# Patient Record
Sex: Male | Born: 1990 | Race: White | Hispanic: No | Marital: Single | Smoking: Former smoker
Health system: Southern US, Community
[De-identification: ages and names within clinical notes are randomized; demographics above are authoritative.]

## PROBLEM LIST (undated history)

## (undated) DIAGNOSIS — T7840XA Allergy, unspecified, initial encounter: Secondary | ICD-10-CM

## (undated) DIAGNOSIS — J329 Chronic sinusitis, unspecified: Secondary | ICD-10-CM

## (undated) DIAGNOSIS — K219 Gastro-esophageal reflux disease without esophagitis: Secondary | ICD-10-CM

## (undated) DIAGNOSIS — J302 Other seasonal allergic rhinitis: Secondary | ICD-10-CM

## (undated) DIAGNOSIS — R569 Unspecified convulsions: Secondary | ICD-10-CM

## (undated) DIAGNOSIS — J45909 Unspecified asthma, uncomplicated: Secondary | ICD-10-CM

## (undated) HISTORY — DX: Allergy, unspecified, initial encounter: T78.40XA

## (undated) HISTORY — DX: Chronic sinusitis, unspecified: J32.9

## (undated) HISTORY — DX: Unspecified convulsions: R56.9

## (undated) HISTORY — DX: Unspecified asthma, uncomplicated: J45.909

---
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
## 2014-03-24 ENCOUNTER — Inpatient Hospital Stay: Payer: Self-pay | Admitting: Internal Medicine

## 2014-03-24 LAB — DRUG SCREEN, URINE

## 2014-03-24 LAB — CBC
HCT: 46 % (ref 40.0–52.0)
HGB: 15.5 g/dL (ref 13.0–18.0)
MCH: 27.3 pg (ref 26.0–34.0)
MCHC: 33.6 g/dL (ref 32.0–36.0)
MCV: 81 fL (ref 80–100)
Platelet: 236 10*3/uL (ref 150–440)
RBC: 5.67 10*6/uL (ref 4.40–5.90)
RDW: 13.8 % (ref 11.5–14.5)
WBC: 12.7 10*3/uL — AB (ref 3.8–10.6)

## 2014-03-24 LAB — BASIC METABOLIC PANEL
ANION GAP: 3 — AB (ref 7–16)
BUN: 11 mg/dL (ref 7–18)
CALCIUM: 8.4 mg/dL — AB (ref 8.5–10.1)
CREATININE: 0.92 mg/dL (ref 0.60–1.30)
Chloride: 107 mmol/L (ref 98–107)
Co2: 28 mmol/L (ref 21–32)
EGFR (African American): >60
EGFR (Non-African Amer.): >60
Glucose: 121 mg/dL — ABNORMAL HIGH (ref 65–99)
Osmolality: 276 (ref 275–301)
Potassium: 3.7 mmol/L (ref 3.5–5.1)
Sodium: 138 mmol/L (ref 136–145)

## 2014-03-24 LAB — TROPONIN I: Troponin-I: <0.02 ng/mL

## 2014-03-24 LAB — D-DIMER(ARMC): D-Dimer: 345 ng/ml

## 2014-03-24 LAB — HEMOGLOBIN A1C: HEMOGLOBIN A1C: 5.5 % (ref 4.2–6.3)

## 2014-03-25 LAB — COMPREHENSIVE METABOLIC PANEL
ALBUMIN: 3.6 g/dL (ref 3.4–5.0)
Alkaline Phosphatase: 51 U/L
Anion Gap: 5 — ABNORMAL LOW (ref 7–16)
BILIRUBIN TOTAL: 0.4 mg/dL (ref 0.2–1.0)
BUN: 14 mg/dL (ref 7–18)
Calcium, Total: 9.1 mg/dL (ref 8.5–10.1)
Chloride: 106 mmol/L (ref 98–107)
Co2: 25 mmol/L (ref 21–32)
Creatinine: 0.9 mg/dL (ref 0.60–1.30)
EGFR (African American): >60
Glucose: 135 mg/dL — ABNORMAL HIGH (ref 65–99)
OSMOLALITY: 274 (ref 275–301)
Potassium: 4.6 mmol/L (ref 3.5–5.1)
SGOT(AST): 23 U/L (ref 15–37)
SGPT (ALT): 49 U/L (ref 12–78)
Sodium: 136 mmol/L (ref 136–145)
Total Protein: 7.4 g/dL (ref 6.4–8.2)

## 2014-03-25 LAB — CBC WITH DIFFERENTIAL/PLATELET
Basophil #: 0 10*3/uL (ref 0.0–0.1)
Basophil %: 0.2 %
EOS PCT: 0.1 %
Eosinophil #: 0 10*3/uL (ref 0.0–0.7)
HCT: 41.5 % (ref 40.0–52.0)
HGB: 14.1 g/dL (ref 13.0–18.0)
LYMPHS ABS: 1.1 10*3/uL (ref 1.0–3.6)
Lymphocyte %: 8.8 %
MCH: 27.4 pg (ref 26.0–34.0)
MCHC: 34 g/dL (ref 32.0–36.0)
MCV: 81 fL (ref 80–100)
MONOS PCT: 3.8 %
Monocyte #: 0.5 x10 3/mm (ref 0.2–1.0)
NEUTROS ABS: 11.3 10*3/uL — AB (ref 1.4–6.5)
NEUTROS PCT: 87.1 %
Platelet: 204 10*3/uL (ref 150–440)
RBC: 5.16 10*6/uL (ref 4.40–5.90)
RDW: 13.6 % (ref 11.5–14.5)
WBC: 12.9 10*3/uL — ABNORMAL HIGH (ref 3.8–10.6)

## 2014-04-29 ENCOUNTER — Emergency Department: Payer: Self-pay | Admitting: Internal Medicine

## 2014-04-29 LAB — BASIC METABOLIC PANEL
Anion Gap: 7 (ref 7–16)
BUN: 8 mg/dL (ref 7–18)
CALCIUM: 9.4 mg/dL (ref 8.5–10.1)
CHLORIDE: 103 mmol/L (ref 98–107)
CREATININE: 0.82 mg/dL (ref 0.60–1.30)
Co2: 29 mmol/L (ref 21–32)
EGFR (African American): >60
EGFR (Non-African Amer.): >60
GLUCOSE: 99 mg/dL (ref 65–99)
OSMOLALITY: 276 (ref 275–301)
Potassium: 3.9 mmol/L (ref 3.5–5.1)
Sodium: 139 mmol/L (ref 136–145)

## 2014-04-29 LAB — CBC
HCT: 49.4 % (ref 40.0–52.0)
HGB: 16.2 g/dL (ref 13.0–18.0)
MCH: 26.9 pg (ref 26.0–34.0)
MCHC: 32.8 g/dL (ref 32.0–36.0)
MCV: 82 fL (ref 80–100)
Platelet: 232 10*3/uL (ref 150–440)
RBC: 6.02 10*6/uL — ABNORMAL HIGH (ref 4.40–5.90)
RDW: 13.8 % (ref 11.5–14.5)
WBC: 8.3 10*3/uL (ref 3.8–10.6)

## 2014-04-29 LAB — TROPONIN I

## 2014-09-11 ENCOUNTER — Emergency Department: Payer: Self-pay | Admitting: Emergency Medicine

## 2014-09-11 LAB — CBC WITH DIFFERENTIAL/PLATELET
BASOS ABS: 0.1 10*3/uL (ref 0.0–0.1)
Basophil %: 0.5 %
EOS PCT: 1.1 %
Eosinophil #: 0.2 10*3/uL (ref 0.0–0.7)
HCT: 45.6 % (ref 40.0–52.0)
HGB: 14.8 g/dL (ref 13.0–18.0)
LYMPHS ABS: 0.8 10*3/uL — AB (ref 1.0–3.6)
LYMPHS PCT: 5.8 %
MCH: 26.6 pg (ref 26.0–34.0)
MCHC: 32.4 g/dL (ref 32.0–36.0)
MCV: 82 fL (ref 80–100)
MONOS PCT: 5.8 %
Monocyte #: 0.8 x10 3/mm (ref 0.2–1.0)
NEUTROS ABS: 11.9 10*3/uL — AB (ref 1.4–6.5)
Neutrophil %: 86.8 %
PLATELETS: 174 10*3/uL (ref 150–440)
RBC: 5.54 10*6/uL (ref 4.40–5.90)
RDW: 13.2 % (ref 11.5–14.5)
WBC: 13.7 10*3/uL — ABNORMAL HIGH (ref 3.8–10.6)

## 2014-09-11 LAB — COMPREHENSIVE METABOLIC PANEL
ALK PHOS: 53 U/L
ANION GAP: 9 (ref 7–16)
Albumin: 3.9 g/dL (ref 3.4–5.0)
BUN: 14 mg/dL (ref 7–18)
Bilirubin,Total: 0.4 mg/dL (ref 0.2–1.0)
CHLORIDE: 110 mmol/L — AB (ref 98–107)
CO2: 21 mmol/L (ref 21–32)
CREATININE: 1.04 mg/dL (ref 0.60–1.30)
Calcium, Total: 8.7 mg/dL (ref 8.5–10.1)
EGFR (Non-African Amer.): >60
GLUCOSE: 124 mg/dL — AB (ref 65–99)
Osmolality: 281 (ref 275–301)
Potassium: 3.7 mmol/L (ref 3.5–5.1)
SGOT(AST): 39 U/L — ABNORMAL HIGH (ref 15–37)
SGPT (ALT): 42 U/L
Sodium: 140 mmol/L (ref 136–145)
TOTAL PROTEIN: 7.1 g/dL (ref 6.4–8.2)

## 2014-09-11 LAB — MAGNESIUM: MAGNESIUM: 1.6 mg/dL — AB

## 2014-09-14 ENCOUNTER — Inpatient Hospital Stay: Payer: Self-pay | Admitting: Internal Medicine

## 2014-09-14 ENCOUNTER — Ambulatory Visit: Payer: Self-pay | Admitting: Neurology

## 2014-09-14 LAB — DRUG SCREEN, URINE
Amphetamines, Ur Screen: NEGATIVE (ref ?–1000)
BENZODIAZEPINE, UR SCRN: POSITIVE (ref ?–200)
Barbiturates, Ur Screen: NEGATIVE (ref ?–200)
Cannabinoid 50 Ng, Ur ~~LOC~~: NEGATIVE (ref ?–50)
Cocaine Metabolite,Ur ~~LOC~~: NEGATIVE (ref ?–300)
MDMA (ECSTASY) UR SCREEN: NEGATIVE (ref ?–500)
METHADONE, UR SCREEN: NEGATIVE (ref ?–300)
Opiate, Ur Screen: NEGATIVE (ref ?–300)
PHENCYCLIDINE (PCP) UR S: NEGATIVE (ref ?–25)
Tricyclic, Ur Screen: NEGATIVE (ref ?–1000)

## 2014-09-14 LAB — COMPREHENSIVE METABOLIC PANEL
ALBUMIN: 4.4 g/dL (ref 3.4–5.0)
ANION GAP: 6 — AB (ref 7–16)
AST: 46 U/L — AB (ref 15–37)
Alkaline Phosphatase: 59 U/L
BUN: 19 mg/dL — ABNORMAL HIGH (ref 7–18)
Bilirubin,Total: 0.3 mg/dL (ref 0.2–1.0)
CHLORIDE: 104 mmol/L (ref 98–107)
Calcium, Total: 9 mg/dL (ref 8.5–10.1)
Co2: 29 mmol/L (ref 21–32)
Creatinine: 1.27 mg/dL (ref 0.60–1.30)
Glucose: 158 mg/dL — ABNORMAL HIGH (ref 65–99)
Osmolality: 283 (ref 275–301)
Potassium: 4.5 mmol/L (ref 3.5–5.1)
SGPT (ALT): 51 U/L
SODIUM: 139 mmol/L (ref 136–145)
Total Protein: 7.8 g/dL (ref 6.4–8.2)

## 2014-09-14 LAB — CBC WITH DIFFERENTIAL/PLATELET
Basophil #: 0.2 10*3/uL — ABNORMAL HIGH (ref 0.0–0.1)
Basophil %: 0.7 %
EOS PCT: 2.7 %
Eosinophil #: 0.6 10*3/uL (ref 0.0–0.7)
HCT: 47.8 % (ref 40.0–52.0)
HGB: 15.1 g/dL (ref 13.0–18.0)
LYMPHS ABS: 10.5 10*3/uL — AB (ref 1.0–3.6)
Lymphocyte %: 46.1 %
MCH: 26.8 pg (ref 26.0–34.0)
MCHC: 31.7 g/dL — ABNORMAL LOW (ref 32.0–36.0)
MCV: 85 fL (ref 80–100)
MONO ABS: 2.2 x10 3/mm — AB (ref 0.2–1.0)
Monocyte %: 9.9 %
NEUTROS PCT: 40.6 %
Neutrophil #: 9.2 10*3/uL — ABNORMAL HIGH (ref 1.4–6.5)
PLATELETS: 310 10*3/uL (ref 150–440)
RBC: 5.65 10*6/uL (ref 4.40–5.90)
RDW: 13.6 % (ref 11.5–14.5)
WBC: 22.8 10*3/uL — ABNORMAL HIGH (ref 3.8–10.6)

## 2014-09-14 LAB — MAGNESIUM: MAGNESIUM: 2 mg/dL

## 2014-09-14 LAB — PHENYTOIN LEVEL, TOTAL: Dilantin: <0.4 ug/mL — ABNORMAL LOW (ref 10.0–20.0)

## 2014-09-15 LAB — CBC WITH DIFFERENTIAL/PLATELET
BASOS ABS: 0 10*3/uL (ref 0.0–0.1)
Basophil %: 0.3 %
EOS PCT: 1.2 %
Eosinophil #: 0.1 10*3/uL (ref 0.0–0.7)
HCT: 40.1 % (ref 40.0–52.0)
HGB: 13.8 g/dL (ref 13.0–18.0)
LYMPHS ABS: 2.8 10*3/uL (ref 1.0–3.6)
LYMPHS PCT: 26.2 %
MCH: 27.9 pg (ref 26.0–34.0)
MCHC: 34.5 g/dL (ref 32.0–36.0)
MCV: 81 fL (ref 80–100)
MONO ABS: 0.9 x10 3/mm (ref 0.2–1.0)
Monocyte %: 8.6 %
NEUTROS ABS: 6.8 10*3/uL — AB (ref 1.4–6.5)
Neutrophil %: 63.7 %
Platelet: 167 10*3/uL (ref 150–440)
RBC: 4.96 10*6/uL (ref 4.40–5.90)
RDW: 13.5 % (ref 11.5–14.5)
WBC: 10.7 10*3/uL — AB (ref 3.8–10.6)

## 2014-09-15 LAB — BASIC METABOLIC PANEL
Anion Gap: 8 (ref 7–16)
BUN: 11 mg/dL (ref 7–18)
CALCIUM: 8.3 mg/dL — AB (ref 8.5–10.1)
Chloride: 108 mmol/L — ABNORMAL HIGH (ref 98–107)
Co2: 26 mmol/L (ref 21–32)
Creatinine: 0.88 mg/dL (ref 0.60–1.30)
EGFR (African American): >60
EGFR (Non-African Amer.): >60
Glucose: 96 mg/dL (ref 65–99)
OSMOLALITY: 282 (ref 275–301)
Potassium: 3.3 mmol/L — ABNORMAL LOW (ref 3.5–5.1)
Sodium: 142 mmol/L (ref 136–145)

## 2014-10-04 ENCOUNTER — Ambulatory Visit: Payer: Self-pay | Admitting: Family Medicine

## 2014-12-30 ENCOUNTER — Emergency Department: Payer: Self-pay | Admitting: Emergency Medicine

## 2014-12-30 LAB — CBC
HCT: 51.5 % (ref 40.0–52.0)
HGB: 16.5 g/dL (ref 13.0–18.0)
MCH: 27.4 pg (ref 26.0–34.0)
MCHC: 32 g/dL (ref 32.0–36.0)
MCV: 86 fL (ref 80–100)
PLATELETS: 253 10*3/uL (ref 150–440)
RBC: 6.01 10*6/uL — ABNORMAL HIGH (ref 4.40–5.90)
RDW: 13.6 % (ref 11.5–14.5)
WBC: 15.9 10*3/uL — AB (ref 3.8–10.6)

## 2014-12-30 LAB — URINALYSIS, COMPLETE
BACTERIA: NONE SEEN
Bilirubin,UR: NEGATIVE
GLUCOSE, UR: NEGATIVE mg/dL (ref 0–75)
Ketone: NEGATIVE
Leukocyte Esterase: NEGATIVE
Nitrite: NEGATIVE
Ph: 5 (ref 4.5–8.0)
Specific Gravity: 1.016 (ref 1.003–1.030)
Squamous Epithelial: NONE SEEN
WBC UR: 1 /HPF (ref 0–5)

## 2014-12-30 LAB — COMPREHENSIVE METABOLIC PANEL
ANION GAP: 26 — AB (ref 7–16)
Albumin: 4.2 g/dL (ref 3.4–5.0)
Alkaline Phosphatase: 64 U/L
BUN: 14 mg/dL (ref 7–18)
Bilirubin,Total: 0.4 mg/dL (ref 0.2–1.0)
CHLORIDE: 102 mmol/L (ref 98–107)
CREATININE: 1.32 mg/dL — AB (ref 0.60–1.30)
Calcium, Total: 9.6 mg/dL (ref 8.5–10.1)
Co2: 10 mmol/L — CL (ref 21–32)
EGFR (African American): >60
Glucose: 155 mg/dL — ABNORMAL HIGH (ref 65–99)
OSMOLALITY: 279 (ref 275–301)
POTASSIUM: 3.9 mmol/L (ref 3.5–5.1)
SGOT(AST): 50 U/L — ABNORMAL HIGH (ref 15–37)
SGPT (ALT): 83 U/L — ABNORMAL HIGH
Sodium: 138 mmol/L (ref 136–145)
TOTAL PROTEIN: 8.2 g/dL (ref 6.4–8.2)

## 2015-03-02 ENCOUNTER — Encounter: Payer: Self-pay | Admitting: Family Medicine

## 2015-03-02 DIAGNOSIS — J329 Chronic sinusitis, unspecified: Secondary | ICD-10-CM | POA: Insufficient documentation

## 2015-03-02 DIAGNOSIS — J45909 Unspecified asthma, uncomplicated: Secondary | ICD-10-CM | POA: Insufficient documentation

## 2015-03-02 DIAGNOSIS — T7840XA Allergy, unspecified, initial encounter: Secondary | ICD-10-CM | POA: Insufficient documentation

## 2015-03-02 DIAGNOSIS — R569 Unspecified convulsions: Secondary | ICD-10-CM | POA: Insufficient documentation

## 2015-03-08 ENCOUNTER — Ambulatory Visit (INDEPENDENT_AMBULATORY_CARE_PROVIDER_SITE_OTHER): Payer: 59 | Admitting: Family Medicine

## 2015-03-08 ENCOUNTER — Encounter: Payer: Self-pay | Admitting: Family Medicine

## 2015-03-08 VITALS — BP 110/78 | HR 74 | Temp 97.8°F | Resp 18 | Ht 74.0 in | Wt 230.0 lb

## 2015-03-08 DIAGNOSIS — J324 Chronic pansinusitis: Secondary | ICD-10-CM | POA: Diagnosis not present

## 2015-03-08 DIAGNOSIS — J4542 Moderate persistent asthma with status asthmaticus: Secondary | ICD-10-CM

## 2015-03-08 MED ORDER — CEFDINIR 300 MG PO CAPS: 300.0000 mg | ORAL_CAPSULE | Freq: Two times a day (BID) | ORAL | Status: DC

## 2015-03-08 MED ORDER — ALBUTEROL SULFATE HFA 108 (90 BASE) MCG/ACT IN AERS: 2.0000 | INHALATION_SPRAY | Freq: Four times a day (QID) | RESPIRATORY_TRACT | Status: DC | PRN

## 2015-03-08 MED ORDER — AZELASTINE-FLUTICASONE 137-50 MCG/ACT NA SUSP: 2.0000 | Freq: Every day | NASAL | Status: DC

## 2015-03-08 MED ORDER — PREDNISONE 20 MG PO TABS: ORAL_TABLET | ORAL | Status: DC

## 2015-03-08 NOTE — Progress Notes (Signed)
Subjective:    Patient ID: Jason Duffy, male    DOB: 1991-05-14, 24 y.o.   MRN: [id number]  HPI Patient is a 24 year old white male here today to establish care. He has a history of chronic pansinusitis which he has been suffering with for years. He is currently under the care of Dr. Andee Poles with ENT and he needs a referral back to that surgeon. He was scheduled to have sinus surgery but his insurance currently required a referral. He has been on 2 different rounds of prednisone and antibiotics without relief. He is tried Flonase, Dymista, Zyrtec, all without relief. He also has moderately persistent asthma with a history of severe asthma attacks even requiring intubation in the emergency room. Patient has recently quit smoking. He is currently using his albuterol 4 times a day. He also has a history of seizure disorder. He's had 2 seizures in his life. Both were associated with severe sinus infections causing him to go to the emergency room. He has not had a seizure in over a year. Past Medical History  Diagnosis Date  . Allergy 2010  . Asthma 2008  . Seizures   . Recurrent sinusitis    Past Surgical History  Procedure Laterality Date  . Tonsillectomy  11/2009   No current outpatient prescriptions on file prior to visit.   No current facility-administered medications on file prior to visit.   Allergies  Allergen Reactions  . Asa [Aspirin]   . Ibuprofen    History   Social History  . Marital Status: Single    Spouse Name: N/A  . Number of Children: N/A  . Years of Education: N/A   Occupational History  . Not on file.   Social History Main Topics  . Smoking status: Former Smoker    Quit date: 02/20/2015  . Smokeless tobacco: Current User    Types: Chew  . Alcohol Use: Yes     Comment: 6 mixed drinks a week  . Drug Use: No  . Sexual Activity: Yes    Birth Control/ Protection: Condom   Other Topics Concern  . Not on file   Social History Narrative   Family  History  Problem Relation Age of Onset  . Depression Mother   . Early death Mother   . Asthma Father   . Mental illness Father     PTSD  . Asthma Brother   . Birth defects Brother   . Arthritis Maternal Grandmother   . Birth defects Maternal Grandmother   . Diabetes Maternal Grandmother   . Hyperlipidemia Maternal Grandmother   . Hypertension Maternal Grandmother   . Vision loss Maternal Grandmother   . Diabetes Maternal Grandfather   . Hypertension Maternal Grandfather   . Vision loss Maternal Grandfather   . Cancer Paternal Grandmother   . Depression Paternal Grandmother   . Diabetes Paternal Grandmother   . Hyperlipidemia Paternal Grandmother   . Hypertension Paternal Grandmother   . Stroke Paternal Grandmother   . Vision loss Paternal Grandmother   . Depression Paternal Grandfather   . Diabetes Paternal Grandfather   . Hypertension Paternal Grandfather   . Vision loss Paternal Grandfather       Review of Systems  All other systems reviewed and are negative.      Objective:   Physical Exam  Constitutional: He is oriented to person, place, and time. He appears well-developed and well-nourished. No distress.  HENT:  Right Ear: Tympanic membrane and ear canal normal.  Left Ear: Tympanic  membrane and ear canal normal.  Nose: Mucosal edema and rhinorrhea present. Right sinus exhibits maxillary sinus tenderness and frontal sinus tenderness. Left sinus exhibits maxillary sinus tenderness and frontal sinus tenderness.  Mouth/Throat: Oropharynx is clear and moist.  Eyes: Conjunctivae and EOM are normal. Pupils are equal, round, and reactive to light.  Neck: Neck supple. No JVD present. No thyromegaly present.  Cardiovascular: Normal rate, regular rhythm and normal heart sounds.  Exam reveals no gallop and no friction rub.   No murmur heard. Pulmonary/Chest: Effort normal and breath sounds normal. No respiratory distress. He has no wheezes. He has no rales. He exhibits no  tenderness.  Abdominal: Soft. Bowel sounds are normal. He exhibits no distension. There is no tenderness. There is no rebound.  Musculoskeletal: Normal range of motion. He exhibits no edema or tenderness.  Lymphadenopathy:    He has no cervical adenopathy.  Neurological: He is alert and oriented to person, place, and time. He has normal reflexes. He displays normal reflexes. No cranial nerve deficit. He exhibits normal muscle tone. Coordination normal.  Skin: He is not diaphoretic.  Psychiatric: He has a normal mood and affect. His behavior is normal. Judgment and thought content normal.  Vitals reviewed.         Assessment & Plan:  Chronic pansinusitis - Plan: Azelastine-Fluticasone (DYMISTA) 137-50 MCG/ACT SUSP, cefdinir (OMNICEF) 300 MG capsule, predniSONE (DELTASONE) 20 MG tablet, Ambulatory referral to ENT, CANCELED: Ambulatory referral to ENT  Acute severe asthma, moderate persistent  Patient has chronic pansinusitis. I will treat him with a six-day prednisone taper pack and Omnicef 300 mg by mouth twice a day for 10 days. Hopefully this will calm his sinuses down so that he can then begin Dymista 2 sprays each nostril twice daily. I also want him to start Zyrtec 10 mg daily. I want him to begin Symbicort 160/4.52 puffs inhaled twice a day to help manage his asthma. I explained to the patient that his overuse of albuterol increases his risk of asthma related death. I will also refer the patient back to his ENT for sinus surgery.

## 2015-03-09 ENCOUNTER — Telehealth: Payer: Self-pay | Admitting: Family Medicine

## 2015-03-09 ENCOUNTER — Other Ambulatory Visit: Payer: Self-pay | Admitting: Family Medicine

## 2015-03-09 MED ORDER — FLUTICASONE PROPIONATE 50 MCG/ACT NA SUSP: 2.0000 | Freq: Every day | NASAL | Status: DC

## 2015-03-09 MED ORDER — AZELASTINE HCL 0.1 % NA SOLN: 2.0000 | Freq: Two times a day (BID) | NASAL | Status: DC

## 2015-03-09 NOTE — Telephone Encounter (Signed)
Received PA from pharm for Dymista - Pt has not been on anything relative to warrant a PA - per MBD can do flonase and astelin. Med sent to pharm and pt aware.

## 2015-03-20 ENCOUNTER — Emergency Department: Payer: Self-pay | Admitting: Physician Assistant

## 2015-04-14 ENCOUNTER — Encounter: Payer: Self-pay | Admitting: Family Medicine

## 2015-04-14 ENCOUNTER — Ambulatory Visit (INDEPENDENT_AMBULATORY_CARE_PROVIDER_SITE_OTHER): Payer: 59 | Admitting: Family Medicine

## 2015-04-14 ENCOUNTER — Telehealth: Payer: Self-pay | Admitting: *Deleted

## 2015-04-14 VITALS — BP 136/70 | HR 88 | Temp 98.5°F | Resp 16 | Ht 74.0 in | Wt 229.0 lb

## 2015-04-14 DIAGNOSIS — J019 Acute sinusitis, unspecified: Secondary | ICD-10-CM

## 2015-04-14 MED ORDER — PREDNISONE 20 MG PO TABS: 40.0000 mg | ORAL_TABLET | Freq: Every day | ORAL | Status: DC

## 2015-04-14 MED ORDER — AMOXICILLIN-POT CLAVULANATE 875-125 MG PO TABS: 1.0000 | ORAL_TABLET | Freq: Two times a day (BID) | ORAL | Status: DC

## 2015-04-14 MED ORDER — BUDESONIDE-FORMOTEROL FUMARATE 160-4.5 MCG/ACT IN AERO: 2.0000 | INHALATION_SPRAY | Freq: Two times a day (BID) | RESPIRATORY_TRACT | Status: DC

## 2015-04-14 NOTE — Telephone Encounter (Signed)
Received request from pharmacy for PA on Symbicort.   PA submitted.   Dx: Asthma

## 2015-04-14 NOTE — Progress Notes (Signed)
Subjective:    Patient ID: Jason SampleMichael Duffy, male    DOB: 1991-08-06, 24 y.o.   MRN: [id number]  HPI Please see previous OV.  Patient ran out of the Symbicort. He is no longer on Symbicort. He never started Dymista due to insurance issues. We did call in a prescription for Flonase and Astelin but the patient never started that. Shortly after discontinuing the antibiotics and steroids the symptoms resumed with severity.  His previous ENT physician has discharged him as a patient due to noncompliance, missed appointments. He is scheduled to see a new ENT physician however that appointment is pending. Today on exam both nasal passages are completely obstructed swollen nasal turbinates, thick white is mcus, and nasal polyp.  Patient is also audible wheezing on exam Past Medical History  Diagnosis Date  . Allergy 2010  . Asthma 2008  . Seizures   . Recurrent sinusitis    Past Surgical History  Procedure Laterality Date  . Tonsillectomy  11/2009   Current Outpatient Prescriptions on File Prior to Visit  Medication Sig Dispense Refill  . albuterol (PROVENTIL HFA;VENTOLIN HFA) 108 (90 BASE) MCG/ACT inhaler Inhale 2 puffs into the lungs every 6 (six) hours as needed for wheezing or shortness of breath. 1 Inhaler 2  . azelastine (ASTELIN) 0.1 % nasal spray Place 2 sprays into both nostrils 2 (two) times daily. Use in each nostril as directed 30 mL 12  . cefdinir (OMNICEF) 300 MG capsule Take 1 capsule (300 mg total) by mouth 2 (two) times daily. 20 capsule 0  . fluticasone (FLONASE) 50 MCG/ACT nasal spray Place 2 sprays into both nostrils daily. 16 g 6  . predniSONE (DELTASONE) 20 MG tablet 3 tabs poqday 1-2, 2 tabs poqday 3-4, 1 tab poqday 5-6 12 tablet 0   No current facility-administered medications on file prior to visit.   Allergies  Allergen Reactions  . Asa [Aspirin]   . Ibuprofen    History   Social History  . Marital Status: Single    Spouse Name: N/A  . Number of Children: N/A    . Years of Education: N/A   Occupational History  . Not on file.   Social History Main Topics  . Smoking status: Former Smoker    Quit date: 02/20/2015  . Smokeless tobacco: Current User    Types: Chew  . Alcohol Use: Yes     Comment: 6 mixed drinks a week  . Drug Use: No  . Sexual Activity: Yes    Birth Control/ Protection: Condom   Other Topics Concern  . Not on file   Social History Narrative     Review of Systems  All other systems reviewed and are negative.      Objective:   Physical Exam  Constitutional: He appears well-developed and well-nourished. No distress.  HENT:  Right Ear: Tympanic membrane and ear canal normal.  Left Ear: Tympanic membrane and ear canal normal.  Nose: Mucosal edema and rhinorrhea present. Right sinus exhibits maxillary sinus tenderness and frontal sinus tenderness. Left sinus exhibits maxillary sinus tenderness and frontal sinus tenderness.  Mouth/Throat: Oropharynx is clear and moist.  Eyes: Conjunctivae are normal. Pupils are equal, round, and reactive to light.  Neck: Neck supple.  Cardiovascular: Normal rate, regular rhythm, normal heart sounds and intact distal pulses.   No murmur heard. Pulmonary/Chest: Effort normal. No respiratory distress. He has wheezes. He has no rales.  Lymphadenopathy:    He has no cervical adenopathy.  Skin: He is not  diaphoretic.  Vitals reviewed.         Assessment & Plan:  Acute rhinosinusitis  This is chronic sinusitis that is unresolved and his acutely worsened. I will place the patient on prednisone 40 mg a day for 2 weeks., Continue the patient on Augmentin 875 mg by mouth twice a day for 3 weeks. Hopefully this will clear the chronic sinusitis. I refilled his Symbicort. I admonished the patient instructed him he needs to be compliant and use both the Flonase and Astelin to help prevent this from returning. At the present time his nasal passages or so obstructed they will not work area and  hopefully after the prednisone and antibiotics however, he can resume his nasal sprays.

## 2015-04-15 NOTE — Telephone Encounter (Signed)
Rec'd faxed approval for Symbicort.  Approved through 04/16/2016  [id number].  Faxed to pharmacy.

## 2015-04-23 NOTE — Discharge Summary (Signed)
Dates of Admission and Diagnosis:  Date of Admission 24-Mar-2014   Date of Discharge 25-Mar-2014   Admitting Diagnosis Asthma Exacerbation   Final Diagnosis acute respiratory failure asthma exacerbation   Discharge Diagnosis 1 acute bronchitis   2 tobacco dependence    Chief Complaint/History of Present Illness The patient is a 24 year old male with past medical history significant for history of asthma, history of seizures, as well as epilepsy, who presented to the hospital with sudden onset of shortness of breath. According to the patient, he was doing well up until approximately 2 hours ago prior to coming to the Emergency Room. He suddenly became very short of breath and very wheezy. He was coughing, but he had no phlegm. He was having significant headaches. He was diaphoretic and in tripod position when he presented to the Emergency Room. His O2 sats were 78% on room air and he was given some nebulizers as well as Solu-Medrol, after which he became a little bit more comfortable. He still wheezes, but feels much more comfortable. He is able to talk in short sentences. He still has nonrebreather on his face.   Allergies:  No Known Allergies:   Lab:  25-Mar-15 09:45   pH (ABG)  7.37  PCO2 41  PO2  213  FiO2 100  Base Excess -1.5  HCO3 23.7  O2 Saturation 99.7  O2 Device nrm  Specimen Site (ABG) RT RADIAL  Patient Temp (ABG) 37.0 (Result(s) reported on 24 Mar 2014 at 09:51AM.)  Lactic Acid, Cardiopulmonary 1.2 (Result(s) reported on 24 Mar 2014 at 09:51AM.)  Cardiology:  25-Mar-15 08:57   Ventricular Rate 139  Atrial Rate 139  P-R Interval 120  QRS Duration 78  QT 292  QTc 444  P Axis 85  R Axis 84  T Axis 67  ECG interpretation Sinus tachycardia with Premature atrial complexes with Aberrant conduction Biatrial enlargement Abnormal ECG No previous ECGs available ----------unconfirmed---------- Confirmed by OVERREAD, NOT ([id number]), editor PEARSON, BARBARA ([id number]) on  03/25/2014 9:54:25 AM  Routine Chem:  25-Mar-15 08:26   Glucose, Serum  121  BUN 11  Creatinine (comp) 0.92  Sodium, Serum 138  Potassium, Serum 3.7  Chloride, Serum 107  CO2, Serum 28  Calcium (Total), Serum  8.4  Osmolality (calc) 276  eGFR (African American) >60  eGFR (Non-African American) >60 (eGFR values <62m/min/1.73 m2 may be an indication of chronic kidney disease (CKD). Calculated eGFR is useful in patients with stable renal function. The eGFR calculation will not be reliable in acutely ill patients when serum creatinine is changing rapidly. It is not useful in  patients on dialysis. The eGFR calculation may not be applicable to patients at the low and high extremes of body sizes, pregnant women, and vegetarians.)  Anion Gap  3  Hemoglobin A1c ([facility]) 5.5 (The American Diabetes Association recommends that a primary goal of therapy should be <7% and that physicians should reevaluate the treatment regimen in patients with HbA1c values consistently >8%.)  Urine Drugs:  [id number]-[id number]  Tricyclic Antidepressant, Ur Qual (comp) NEGATIVE (Result(s) reported on 24 Mar 2014 at 02:21PM.)  Amphetamines, Urine Qual. NEGATIVE  MDMA, Urine Qual. NEGATIVE  Cocaine Metabolite, Urine Qual. NEGATIVE  Opiate, Urine qual NEGATIVE  Phencyclidine, Urine Qual. NEGATIVE  Cannabinoid, Urine Qual. NEGATIVE  Barbiturates, Urine Qual. NEGATIVE  Benzodiazepine, Urine Qual. NEGATIVE (----------------- The URINE DRUG SCREEN provides only a preliminary, unconfirmed analytical test result and should not be used for non-medical  purposes.  Clinical consideration and professional judgment  should be  applied to any positive drug screen result due to possible interfering substances.  A more specific alternate chemical method must be used in order to obtain a confirmed analytical result.  Gas chromatography/mass spectrometry (GC/MS) is the preferred confirmatory method.)  Methadone, Urine Qual.  NEGATIVE  Cardiac:  25-Mar-15 08:26   Troponin I < 0.02 (0.00-0.05 0.05 ng/mL or less: NEGATIVE  Repeat testing in 3-6 hrs  if clinically indicated. >0.05 ng/mL: POTENTIAL  MYOCARDIAL INJURY. Repeat  testing in 3-6 hrs if  clinically indicated. NOTE: An increase or decrease  of 30% or more on serial  testing suggests a  clinically important change)    12:41   Troponin I < 0.02 (0.00-0.05 0.05 ng/mL or less: NEGATIVE  Repeat testing in 3-6 hrs  if clinically indicated. >0.05 ng/mL: POTENTIAL  MYOCARDIAL INJURY. Repeat  testing in 3-6 hrs if  clinically indicated. NOTE: An increase or decrease  of 30% or more on serial  testing suggests a  clinically important change)    16:26   Troponin I < 0.02 (0.00-0.05 0.05 ng/mL or less: NEGATIVE  Repeat testing in 3-6 hrs  if clinically indicated. >0.05 ng/mL: POTENTIAL  MYOCARDIAL INJURY. Repeat  testing in 3-6 hrs if  clinically indicated. NOTE: An increase or decrease  of 30% or more on serial  testing suggests a  clinically important change)  Routine Coag:  25-Mar-15 08:26   D-Dimer, Quantitative 345 (INTERPRETATION <> Exclusion of Venous Thromboembolism (VTE) - OUTPATIENT ONLY       (Emergency Department or [place])             0-499 ng/ml (FEU)  : With a low to intermediate pretest                                  probability for VTE this test result                                  excludes the diagnosis of VTE.             > 499 ng/ml (FEU)  : VTE not excluded; additional work up                                  for VTE is required. <> Testing on Inpatients and Evaluation of Disseminated Intravascular        Coagulation (DIC)             Reference Range:  0-499 ng/ml (FEU))  Routine Hem:  25-Mar-15 08:26   WBC (CBC)  12.7  RBC (CBC) 5.67  Hemoglobin (CBC) 15.5  Hematocrit (CBC) 46.0  Platelet Count (CBC) 236 (Result(s) reported on 24 Mar 2014 at 09:17AM.)  MCV 81  MCH 27.3  MCHC 33.6  RDW 13.8   PERTINENT  RADIOLOGY STUDIES: XRay:    25-Mar-15 08:38, Chest 1 View AP or PA  Chest 1 View AP or PA   REASON FOR EXAM:    shortness of breath  COMMENTS:       PROCEDURE: DXR - DXR CHEST 1 VIEWAP OR PA  - Mar 24 2014  8:38AM     CLINICAL DATA:  Shortness of breath    EXAM:  CHEST - 1 VIEW    COMPARISON:  None.  FINDINGS:  The heart size and mediastinal contours are within normal limits.  Both lungs are clear. The visualized skeletal structures are  unremarkable.     IMPRESSION:  No active disease.      Electronically Signed    By: Conchita Paris M.D.    On: 03/24/2014 08:45         Verified TH:YHOOILNZ Grayland Ormond, M.D.,  LabUnknown:  PACS Image    Pertinent Past History:  Pertinent Past History Asthma Seizure Tobacco Dependence   Hospital Course:  Hospital Course 1 Acute resp failure from asthma exacerbation 2. Asthma exacerbation: This seems to have responded well to IV steroids. He had some wheezing on lung exam, but overall he has improved. He does not require Oxygen and is not having shortness of breath or dyspnea. He will need Advair and Albuterol inhlaer at discharge  3. Tobacco dependence: he says he quit starting yesterday. We encourage him to quit smoking and he was counselled. 4.Acute bronchitis: This was the etiology of his asthma exacerbation. He will be discharged on Azithromycin.  5. Leukocytosis no PNA or infection   Condition on Discharge Stable   Code Status:  Code Status Full Code   DISCHARGE INSTRUCTIONS HOME MEDS:  Medication Reconciliation: Patient's Home Medications at Discharge:     Medication Instructions  albuterol cfc free 90 mcg/inh inhalation aerosol  2 puff(s) inhaled 4 times a day, As Needed   prednisone 10 mg oral tablet  6 tab(s) orally once a day x 2 daystab(s) orally once a day x 2 daystab(s) orally once a day x 2 daystab(s) orally once a day x 2 daystab(s) orally once a day x 2 daystab(s) orally once a day x 2 days    azithromycin 500 mg oral tablet  1 tab(s) orally once a day x 4 days   nicotine 14 mg/24 hr transdermal film, extended release  1 patch transdermal once a day   nicotine 10 mg inhalation device  2  inhaled    advair diskus 250 mcg-50 mcg inhalation powder  1 puff(s) inhaled 2 times a day     Physician's Instructions:  Home Health? No   Treatments None   Home Oxygen? No   Diet Regular   Activity Limitations As tolerated   Referrals None   Return to Work 03/26/2014   Time frame for Follow Up Appointment 1-2 weeks  your PCP   Electronic Signatures: Bettey Costa (MD)  (Signed 26-Mar-15 13:28)  Authored: ADMISSION DATE AND DIAGNOSIS, CHIEF COMPLAINT/HPI, Allergies, PERTINENT LABS, PERTINENT RADIOLOGY STUDIES, PERTINENT PAST HISTORY, HOSPITAL COURSE, DISCHARGE INSTRUCTIONS HOME MEDS, PATIENT INSTRUCTIONS   Last Updated: 26-Mar-15 13:28 by Bettey Costa (MD)

## 2015-04-23 NOTE — Discharge Summary (Signed)
PATIENT NAME:  Jason Duffy, Jason Duffy MR#:  [id number] DATE OF BIRTH:  [birth year]  DATE OF ADMISSION:  09/14/2014  DATE OF DISCHARGE:  09/15/2014  ADMITTING PHYSICIAN: Angelica Ranavid Hower, MD  DISCHARGING PHYSICIAN: Etheleen Mayhewadhika Kalsetti, MD   PRIMARY CARE PHYSICIAN: None.   CONSULTATIONS IN THE HOSPITAL:   1.  ENT consultation by Bud Facereighton Vaught, MD  2.  Neurology consultation by Dr. Loretha BrasilZeylikman. 3.  Pulmonary critical care consultation by Dr. Belia HemanKasa.   DISCHARGE DIAGNOSES:  1.  Acute respiratory failure requiring intubation for 1 day.  2.  Asthma exacerbation. 3.  Aspirin and non-steroidal antiinflammatory drugs hypersensitivity, triggering his asthma.   4.  Samter's triad with asthma, aspirin hypersensitivity and chronic sinusitis.  5.  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  6.  Chronic sinusitis with significant sinus disease noted on CT.  DISCHARGE MEDICATIONS:   1.  Nicotine patch 14 mg transdermal daily.  2.  Fexofenadine pseudoephedrine 1 tablet every 12 hours.    3.  Keppra 500 mg p.o. b.i.d.  4.  Flonase nasal spray 2 sprays each nostril once a day.  5.  Singulair 10 mg p.o. daily.  6.  Levaquin 500 mg p.o. daily for 19 days.  7.  Prednisone taper over 12 days.   DISCHARGE HOME OXYGEN: None.   DISCHARGE DIET: Regular diet.   DISCHARGE ACTIVITY: As tolerated.   FOLLOW UP INSTRUCTIONS:  1.   ENT follow-up in 2 to 3 weeks.  2.  Advised to avoid aspirin and other NSAIDs.    LABORATORY DATA AND IMAGING:  Prior to discharge:  Sodium 142, potassium 3.3, chloride 108, bicarbonate 26, BUN 11, creatinine 0.88, glucose 96 and calcium of 8.3.   WBC 10.7, hemoglobin 13.8, hematocrit 40.1, platelet count 167.  CT of the sinuses without contrast showing significant sinus disease with no sinus cavity, either opacified or near completely opacified with mucosal thickening and trapped secretions.  Osteomeatal complexes are occluded.  Frontal, lymph nodal and sphenoid nodal sinuses recesses are also  occluded.  No bone reabsorption or sinus wall dehiscence noted.   Urine toxicology screen positive only for benzos given for seizures.   CT of the head on admission showing no acute intracranial pathology, intubated mucus noted in completely filling paranasal sinuses and ethmoid air cells.  Chest x-ray on admission showing no acute cardiopulmonary disease.   BRIEF HOSPITAL COURSE:  1.  Jason Duffy is a 24 year old young Caucasian male with past medical history significant for asthma and also seizure disorder, presented to the hospital for asthma exacerbation and noted to have a seizure and was intubated for airway protection.  ENT suspects that the patient probably has Samter's triad which has either aspirin or NSAID hypersensitivity with asthma and also chronic sinusitis.  As he was in the ER for his sinus if a couple of days prior to this admission, and he was discharged on some antibiotics, prednisone and also ibuprofen.  This admission was provoked by taking ibuprofen, which resulted in asthma exacerbation, resulted in seizures which is what has happened in the past before.   2.  Acute respiratory failure secondary to asthma exacerbation, intubated and extubated less than 24 hours later, has been doing fine, no wheezing.  He will be discharged on prednisone taper along with Levaquin.  3.  Chronic sinusitis with CT showing significant sinus disease.  The patient will need sinus clean up as an outpatient and he will follow up with Ear, Nose and Throat in 2 weeks.  He will be on  Levaquin, Flonase, Singulair and also a prednisone taper for now.  He will also need allergy testing as an outpatient.  He is strongly advised to stay away from aspirin and NSAIDs.    4.  Seizure disorder triggered by asthma, has been off his epileptic medications since he has moved from [place] to [facility], seen by the neurologist, Dr. Loretha Brasil, recommended Keppra.  Other instructions for seizures given about driving  restrictions.    His course has been otherwise uneventful in the hospital.     DISCHARGE CONDITION: Stable.   DISCHARGE DISPOSITION: Home.   TIME SPENT ON DISCHARGE: 45 minutes.      ____________________________ Enid Baas, MD rk:DT D: 09/15/2014 13:37:31 ET T: 09/15/2014 14:45:52 ET JOB#: [id number]  cc: Enid Baas, MD, <Dictator> Kyung Rudd, MD Enid Baas MD ELECTRONICALLY SIGNED 09/30/2014 10:02

## 2015-04-23 NOTE — H&P (Signed)
PATIENT NAME:  Jason Duffy, Jason Duffy MR#:  [id number] DATE OF BIRTH:  04/04/1991  DATE OF ADMISSION:  09/14/2014  REFERRING PHYSICIAN:  Dr. Manson Passey  PRIMARY CARE PHYSICIAN: None.   CHIEF COMPLAINT: Short of breath.   HISTORY OF PRESENT ILLNESS: A 24 year old Caucasian gentleman with history of asthma  which is moderate persistent by symptoms, as well as seizure disorder, presenting with shortness of breath. Patient was not able to provide any information given mental status and medical condition.  Per the father who is at bedside, he had a recent grand mal seizure about 3-4 days ago. Otherwise, last seizure greater than a year ago. He is on no home medications. He uses p.r.n. albuterol on a daily basis; however, has no complaints of shortness of breath or worsening symptomatology until today. The patient complained of shortness of breath and asked to be taken to the hospital. On arrival to the Emergency Department, he was hypoxemic, in respiratory distress, unable to speak in full sentences, followed by a witnessed grand mal seizure. The patient was intubated for respiratory distress and hypoxemia. Patient unable to provide any meaningful information given mental status and medical condition.  REVIEW OF SYSTEMS: Unobtainable given patient's mental status and medical condition.   PAST MEDICAL HISTORY: Seizure disorder. Last seizure 4 days ago. Otherwise greater than 1 year ago. Asthma, mild-moderate persistent by  symptoms.   SOCIAL HISTORY: Remote tobacco use. Denies any alcohol or drug use.   FAMILY HISTORY: No known cardiovascular or pulmonary disorders.   ALLERGIES: No known drug allergies.   HOME MEDICATIONS: Takes albuterol 90 mcg inhalation 2 puffs 4 times daily as needed for shortness of breath.   PHYSICAL EXAMINATION:  VITAL SIGNS: Temperature 97.9, heart rate on arrival 149. He was apneic on arrival, saturating in the low teens. Currently heart rate 108, respirations 20, blood pressure  95/47. Saturating 100% on assist control. Weight 108.9 kg. BMI 30.8.  GENERAL: Critically ill-appearing Caucasian gentleman who is currently intubated and sedated.  HEAD: Normocephalic, atraumatic.  EYES: Pupils equal, round, sluggishly reactive to light. Extraocular muscles unable to fully assess given medical state and medical condition.  EARS, NOSE THROAT: Clear without exudates. No external lesions.  NECK: Supple. No thyromegaly. No nodules. No JVD.  MOUTH: Moist mucosal membrane. Dentition intact. No abscess noted.  NECK: Supple. No thyromegaly. No nodules. No JVD.  PULMONARY: Prolonged expiratory phase with scant wheezing, most prominent over left lung fields with coarse breath sounds scattered throughout all lung fields. Currently on ventilator and breathing at a set rate. No use of accessory muscles now. on ventilator.  CHEST: Nontender to palpation. CARDIOVASCULAR: S1, S2, tachycardic. No murmurs, rubs, or gallops. No edema. Pedal pulses 2+ bilaterally.  GASTROINTESTINAL: Soft, nontender, nondistended. No masses. Positive bowel sounds. No hepatosplenomegaly.  MUSCULOSKELETAL: No swelling, clubbing, or edema. Range of motion full in all extremities.  NEUROLOGIC: Cranial nerves II through XII intact. No gross focal deficits. Sensation intact. Reflexes  intact. SKIN: No ulcerations, lesions, rashes, or cyanosis. Skin warm, dry. Turgor intact. PSYCHIATRIC: Unable to fully assess given patient's mental status and medical condition.   Unable to fully assess given patient's mental status and medical condition. He is currently sedated and intubated; however,  he did have spontaneous movements of all extremities prior to sedation and.   LABORATORY DATA: Sodium 139, potassium 4.5, chloride 104, bicarbonate 29, BUN 19, creatinine 1.27, glucose 158. LFTs within normal limits. WBC 22.8, hemoglobin 15.1, platelets of 310,000. ABG 7.17/66/389. Bicarbonate 24.1. Lactic acid 2.1. This  is on assist control  18/450, PEEP of 5. Chest x-ray performed. Endotracheal tube 2 cm above carina. No acute cardiopulmonary process noted.  ASSESSMENT AND PLAN: A 24 year old Caucasian gentleman with history of mild persistent asthma based on symptomatology. presented with shortness of breath. While in the Emergency Department, he was in respiratory distress followed by a grand mal seizure.  1.  Acute respiratory failure with hypercapnia, as well as hypoxemia requiring intubation secondary to asthma. Provide DuoNeb treatments q.4 h. Steroids 60 mg IV daily. Magnesium 2 grams now. Full ventilator support. Weaning FiO2 and PEEP as tolerated. Can decrease FiO2 now. Propofol drip for sedation. We can also add p.r.n. fentanyl and Versed if required.  2.  Seizures. He is on no home medications. He had IV Dilantin loaded in the Emergency Department. Will consult neurology. We will get a head CT as he has had increased seizures the past few days and his last one was about 6 years ago.  3.  Lactic acidosis, likely secondary to hypoxia, as well as seizures. Provide IV fluid hydration, keep mean arterial pressure greater than 65.  4.  Leukocytosis. No evidence of infection at the time. Likely related to seizure activity. We will hold off for antibiotics for now.  5.  Venous thromboembolism prophylaxis. Give heparin subcutaneous.  6.  CODE STATUS: THE PATIENT IS FULL CODE.   CRITICAL CARE TIME SPENT: 55 minutes   ____________________________ Cletis Athensavid K. Matson Welch, MD dkh:am D: 09/14/2014 01:30:01 ET T: 09/14/2014 04:17:37 ET JOB#: [id number]  cc: Cletis Athensavid K. Alquan Morrish, MD, <Dictator> Arabell Neria Synetta ShadowK Viona Hosking MD ELECTRONICALLY SIGNED 09/14/2014 20:29

## 2015-04-23 NOTE — H&P (Signed)
PATIENT NAME:  Jason Duffy, MICHEAL MR#:  [id number] DATE OF BIRTH:  October 20, 1991  DATE OF ADMISSION:  03/24/2014  PRIMARY CARE PHYSICIAN: None.   The patient is a 24 year old male with past medical history significant for history of asthma, history of seizures, as well as epilepsy, who presented to the hospital with sudden onset of shortness of breath. According to the patient, he was doing well up until approximately 2 hours ago prior to coming to the Emergency Room. He suddenly became very short of breath and very wheezy. He was coughing, but he had no phlegm. He was having significant headaches. He was diaphoretic and in tripod position when he presented to the Emergency Room. His O2 sats were 78% on room air and he was given some nebulizers as well as Solu-Medrol, after which he became a little bit more comfortable. He still wheezes, but feels much more comfortable. He is able to talk in short sentences. He still has nonrebreather on his face.   PAST MEDICAL HISTORY: Significant for history of asthma, history of seizure, epilepsy; last seizure was 6 years ago. He is off medications for seizure.   MEDICATIONS: Albuterol inhaler as needed.   PAST SURGICAL HISTORY:  The patient had tonsillectomy as well as colonoscopy at the age of 24 for some kind of unknown intestinal problems.   ALLERGIES: None.   FAMILY HISTORY: The patient's mother died of PE. The patient's other family history is significant for diabetes. The patient's grandmother had CVA at the age of 24. Also, fibromyalgia as well as asthma in family members.   SOCIAL HISTORY: The patient is single, has no children. Smokes about 1 pack per day. His last cigarette was yesterday at 6:00 p.m. He is trying to cut down. He works as a [employer]. He denies any drugs.   REVIEW OF SYSTEMS:  CONSTITUTIONAL: Positive for cough with wheezes, as well as shortness of breath and painful respirations. He admits of having some right chest pains whenever he  takes a deep breath; also, sinus congestion as well as postnasal drainage, arrhythmias, palpitations during the episodes of asthma attack. Denies any high fevers, chills, fatigue, weakness, pains, weight loss or gain.  EYES: Denies any blurry vision, double vision, glaucoma or cataracts.  ENT: Denies any tinnitus, allergies, epistaxis, sinus pain, dentures or difficulty swallowing.  RESPIRATORY: Denies any hemoptysis, TB or pneumonia. CARDIOVASCULAR:  Denies any orthopnea, edema, palpitations or syncope.  GASTROINTESTINAL: Denies nausea, vomiting, diarrhea or constipation.  GENITOURINARY: Denies hematuria, frequency or incontinence.  ENDOCRINOLOGY: Denies any polydipsia, nocturia, thyroid problems, heat or cold intolerance or thirst. HEMATOLOGY: Denies anemia, easy bruising, bleeding or swollen glands.  SKIN: Denies acne, rashes or change in moles.  MUSCULOSKELETAL: Denies arthritis, cramps, swelling or gout.  NEUROLOGIC: No numbness or tremors.  PSYCHIATRY:  Denies anxiety, insomnia.  PHYSICAL EXAMINATION:  VITAL SIGNS:  On arrival to the hospital, the patient's temperature was not measured. Pulse was 140. Respiratory rate  was 26, blood pressure 166/99, saturation was 78% on room air.  GENERAL: This is a well-developed, well-nourished male in moderate distress, sitting on the stretcher.  HEENT: Pupils are equal, reactive to light. Extraocular movements intact. No icterus or conjunctivitis.  Has normal hearing. No pharyngeal erythema.  Mucosa is moist. He is somewhat diaphoretic.    NECK: No masses. Supple, nontender. Thyroid is not enlarged. No adenopathy. No JVD or carotid bruits bilaterally.  Full range of motion.  LUNGS: Mildly diminished breath sounds as well as rales and rhonchi and wheezing  were noted. Labored inspirations, as well as increased effort to breathe. He is in mild to moderate respiratory distress. He has a nonrebreather on his face.   CARDIOVASCULAR: S1 and S2 appreciated. No  murmurs, gallops or rubs. PMI not lateralized.  Chest is nontender to palpation.   EXTREMITIES:  1+ pedal pulses.  No lower extremity edema. No tenderness or cyanosis was noted.  ABDOMEN: Soft, nontender. Bowel sounds are present. No hepatosplenomegaly or masses were noted.  RECTAL: Deferred.  MUSCULOSKELETAL: Muscle strength: Able to move all extremities. No cyanosis, degenerative joint disease or kyphosis. Gait was not tested.  SKIN: Did not reveal any rashes, lesions, erythema, nodularity or induration. It was warm and moist to palpation.  LYMPHATIC: No lymphadenopathy in the cervical region.  NEUROLOGICAL: Cranial nerves grossly intact. Sensory is intact. No dysarthria or aphasia. The patient is alert, oriented to time, person and place, cooperative. Memory is good.  PSYCHIATRIC: No significant confusion, agitation, depression noted.   LABORATORY DATA: BMP showed glucose of 121, calcium 8.4, otherwise unremarkable. Troponin less than 0.02. White blood cell count is elevated to 12.7. Hemoglobin was 15.5. Platelet count was 236. ABGs were done on 100% FiO2; pH was 7.37. PCO2 was 41. PO2 was 213. Saturation was 99.7% on nonrebreather with lactic acid level of 1.2. EKG revealed sinus tachy with premature atrial complexes with aberrant conduction at 139 beats per minute. Normal axis, biatrial enlargement, and no acute ST-T changes were noted.   RADIOLOGIC STUDIES: Chest x-ray, 1 view, 03/24/2014, revealed no active disease.   ASSESSMENT AND PLAN: 1. Acute respiratory failure, likely due to asthma exacerbation. Admit to the medical floor. Continue him on oxygen and wean him off nonrebreather. Continue Solu-Medrol, inhalers, as well as nebulizers. We will get pulmonary consultation. We will get also d-dimer and CT scan of the chest for pulmonary embolism, if d-dimer is elevated. The patient's mother died of pulmonary embolism. I am concerned about possible coagulopathy.  2.  Acute bronchitis. I put the  patient on Zithromax.  3.  Hyperglycemia. Get hemoglobin A1c.  4.  Leukocytosis. Follow with therapy.  5.  Tobacco abuse. Nicotine replacement therapy will be initiated. Discussed cessation for approximately 4 to 5 minutes.   TIME SPENT:  One hour.    ____________________________ Katharina Caper, MD rv:dmm D: 03/24/2014 10:21:55 ET T: 03/24/2014 11:03:47 ET JOB#: [id number]  cc: Katharina Caper, MD, <Dictator> Dywane Peruski MD ELECTRONICALLY SIGNED 04/06/2014 21:11

## 2015-04-23 NOTE — Consult Note (Signed)
PATIENT NAME:  Jason Duffy, MICHEAL MR#:  [id number] DATE OF BIRTH:  [birth year]  DATE OF CONSULTATION:  09/14/2014  REFERRING PHYSICIAN:   CONSULTING PHYSICIAN:  Kyung Ruddreighton C. Lillyth Spong, MD  REASON FOR CONSULTATION: History of chronic sinusitis.   HISTORY OF PRESENT ILLNESS:  The patient is a 24 year old male who I was consulted for chronic sinusitis. He was actually admitted for asthma exacerbation as well as for seizure.  He intubated temporarily earlier today and has been extubated. He reports that he has a long-standing history of chronic sinusitis and was [place] in [place] who had discussed medical and surgical management for with him in the past. Unfortunately he lost his insurance at that point. He recently had worsening asthma type symptoms and postnasal drip and purulent drainage from his sinuses. He also reports an aspirin sensitivity where whenever he takes aspirin develops worse asthma.   PAST MEDICAL HISTORY:  Significant for seizure disorder and asthma with multiple hospitalizations.   SOCIAL HISTORY:  The patient denies any illicit drug use or alcohol, has remote tobacco use.   FAMILY HISTORY: No history of cardiovascular or pulmonary disorders.   ALLERGIES: HE HAS NO KNOWN DRUG ALLERGIES BUT AS STATED ABOVE HE HAS A SENSITIVITY TO ASPIRIN WITH WORSENING HIS ASTHMA TYPE SYMPTOMS.   REVIEW OF SYSTEMS: The patient has a sore throat, nasal congestion, postnasal drip, purulent drainage and a cough as well as mild wheezing.   CURRENT MEDICATIONS: Include Tylenol, Augmentin, fluticasone, Advair, heparin, Keppra, Singulair, morphine, Zofran, Levaquin.    PHYSICAL EXAMINATION:   VITAL SIGNS:  Temperature is 97.9, pulse is 75, respirations 13, blood pressure 126/76, pulse oximetry is 100% on room air.  GENERAL: He is a well-nourished, well-developed male in no acute distress.  HEAD: Normocephalic, atraumatic.  EARS: EACs are clear bilaterally. TMs are tight. No perforation or  effusion.  NOSE: Reveals significant amount of congestion bilaterally with apparent polyposis as well as purulent drainage emanating from his middle meatus bilaterally.  ORAL CAVITY: Oropharynx reveals status post a tonsillectomy with some mild erythema posteriorly.  NECK: Supple. No lymphadenopathy or thyromegaly.  LUNGS: Respirations are unlabored. EXTREMITIES: Peripheral pulses are regular.   CT scan is reviewed today which reveals pansinusitis with partial opacification of the maxillary,  ethmoid, sphenoid, and frontal sinuses as well as apparent soft tissue mass or density in his nasal cavity.   IMPRESSION:  Nasal polyposis, aspirin sensitivity, and asthma and Samter triad.   PLAN: I discussed my findings with the patient and his father. I have recommended strict avoidance of aspirin-containing products.  I have also recommended treatment with Levaquin as well as a Sterapred DS 12 day taper as well as the addition of Singulair and continuation of the fluticasone but twice daily. I would like to see him in my office in 2 weeks for a repeat evaluation and for a discussion of possible surgical options if things persist. The patient and family demonstrated understanding and agree with the plan. This was also discussed with Dr. Santiago Gladavid Kasa.   ____________________________ Kyung Ruddreighton C. Emmani Lesueur, MD ccv:bu D: 09/14/2014 21:01:50 ET T: 09/14/2014 21:26:25 ET JOB#: [id number]  cc: Kyung Ruddreighton C. Aalayah Riles, MD, <Dictator> Kyung RuddREIGHTON C Jeris Easterly MD ELECTRONICALLY SIGNED 09/26/2014 9:48

## 2015-04-23 NOTE — Consult Note (Signed)
PATIENT NAME:  Jason Duffy, Jason Duffy MR#:  [id number] DATE OF BIRTH:  Dec 23, 1991  DATE OF CONSULTATION:  09/14/2014  CONSULTING PHYSICIAN:  Pauletta BrownsYuriy Saory Carriero, MD  REASON FOR CONSULTATION: Seizure activity.   HISTORY OF PRESENT ILLNESS:  A 24 year old Caucasian male with past medical history of asthma, multiple histories of asthma exacerbations, seizure disorder since he was a child. When questioned about the patient's seizure disorder, the patient states he has been off antiepileptics for a prolonged period of time, for at least a few years with no seizures. Recent increased history of seizures in the past few months about 3 or 4 generalized tonic-clonic episodes. The patient states that these seizures are provoked by shortness of breath. The patient was [facility] where he was diagnosed seizures that were provoked by hypoxia, likely from sinus obstruction. Currently, the patient is back to his baseline. He is status post extubation. In the Emergency Department, the patient was loaded with Dilantin and he is currently Keppra 500 mg twice a day.   REVIEW OF SYSTEMS:  Currently no shortness of breath.  Feels he is at baseline. No visual deficits. No chest pain. No abdominal pain. No heat or cold intolerance. No weakness on one side of the body compared to the other and no depression or anxiety.   PAST MEDICAL HISTORY: Seizure disorder since the patient was a child; history of asthma.   SOCIAL HISTORY: Remote tobacco use. Denies any alcohol or drug use.   FAMILY HISTORY: No cardiovascular disease.   ALLERGIES: No known drug allergies.   HOME MEDICATIONS: Include albuterol.   PHYSICAL EXAMINATION: VITAL SIGNS: Include a pulse of 69, respirations of 18, blood pressure of 112/66, pulse oximetry 100%.  NEUROLOGIC: The patient is alert, awake, oriented to time, place, location, the reason why he is in the hospital. Facial sensation intact. Facial motor is intact. Tongue is midline. Uvula  elevates symmetrical. Shoulder shrug is intact. Extraocular movements are intact. Motor strength 5/5 bilateral upper and lower extremities.  Sensation intact to light touch and temperature. Coordination: Finger-to-nose intact. Reflexes are symmetrical bilaterally. Gait: The patient able to ambulate without any assistance.   IMPRESSION: A 24 year old gentleman with multiple episodes of generalized tonic-clonic seizures, about 3 to 4 of them, remote history of seizures since, as per family, when the patient was very young, probably near childbirth.  Was off antiepileptics for a number of years.   PLAN: Because of increased history of seizures, I agree with continuing him on a small dose of Keppra 500 mg q. 12 hours. The [facility] was [facility] where he was diagnosed with seizures that were likely provoked by hypoxia due to sinus obstruction, as per the patient. The patient did agree with continuing on a daily antiepileptic medication of 500 mg of Keppra q. 12 hours for at least a 24-month period of time. Driving was discussed with the patient; no swimming alone. The patient does need case management/social worker because [place] [employer] [place] and [place] to [place] the [place] medications [place]. As per father who is also at bedside, the patient does intend to stay in [place].   Thank you.  It was a pleasure seeing this patient. Please call with any questions.   ____________________________ Pauletta BrownsYuriy Braxtyn Dorff, MD yz:lr D: 09/14/2014 16:01:50 ET T: 09/14/2014 16:15:27 ET JOB#: [id number]  cc:   Pauletta BrownsYURIY Dawaun Brancato MD ELECTRONICALLY SIGNED 10/04/2014 14:23

## 2015-04-23 NOTE — Consult Note (Signed)
Brief Consult Note: Diagnosis: Chronic sinusitis, samter's triad.   Patient was seen by consultant.   Consult note dictated.   Recommend to proceed with surgery or procedure.   Discussed with Attending MD.   Comments: Severe sinusitis on CT scan, Asthma, aspirin sensitivity c/w Samter's  Rec:  1)  Levaquin for 21 days 2)  Sterapred DS 12 day taper 3)  Singulair 10mg  qhs 4)  Flonase 5)  Avoidance of NSAIDS 6)  Allergy evaluation and follow up in 2 weeks as outpatient.  Electronic Signatures: Fayelynn Distel, Rayfield Citizenreighton Charles (MD)  (Signed 15-Sep-15 18:12)  Authored: Brief Consult Note   Last Updated: 15-Sep-15 18:12 by Flossie DibbleVaught, Timika Muench Charles (MD)

## 2015-05-11 ENCOUNTER — Other Ambulatory Visit: Payer: Self-pay

## 2015-05-11 ENCOUNTER — Emergency Department (HOSPITAL_COMMUNITY)
Admission: EM | Admit: 2015-05-11 | Discharge: 2015-05-11 | Disposition: A | Discharge status: Home / Self Care | Payer: 59 | Attending: Emergency Medicine | Admitting: Emergency Medicine

## 2015-05-11 ENCOUNTER — Encounter (HOSPITAL_COMMUNITY): Payer: Self-pay | Admitting: Emergency Medicine

## 2015-05-11 DIAGNOSIS — S00512A Abrasion of oral cavity, initial encounter: Secondary | ICD-10-CM | POA: Diagnosis not present

## 2015-05-11 DIAGNOSIS — Z87891 Personal history of nicotine dependence: Secondary | ICD-10-CM | POA: Insufficient documentation

## 2015-05-11 DIAGNOSIS — Y9389 Activity, other specified: Secondary | ICD-10-CM | POA: Insufficient documentation

## 2015-05-11 DIAGNOSIS — Y9289 Other specified places as the place of occurrence of the external cause: Secondary | ICD-10-CM | POA: Insufficient documentation

## 2015-05-11 DIAGNOSIS — R569 Unspecified convulsions: Secondary | ICD-10-CM | POA: Diagnosis not present

## 2015-05-11 DIAGNOSIS — Z7952 Long term (current) use of systemic steroids: Secondary | ICD-10-CM | POA: Diagnosis not present

## 2015-05-11 DIAGNOSIS — Y998 Other external cause status: Secondary | ICD-10-CM | POA: Diagnosis not present

## 2015-05-11 DIAGNOSIS — Z792 Long term (current) use of antibiotics: Secondary | ICD-10-CM | POA: Insufficient documentation

## 2015-05-11 DIAGNOSIS — X58XXXA Exposure to other specified factors, initial encounter: Secondary | ICD-10-CM | POA: Insufficient documentation

## 2015-05-11 DIAGNOSIS — J45909 Unspecified asthma, uncomplicated: Secondary | ICD-10-CM | POA: Insufficient documentation

## 2015-05-11 DIAGNOSIS — Z7951 Long term (current) use of inhaled steroids: Secondary | ICD-10-CM | POA: Insufficient documentation

## 2015-05-11 HISTORY — DX: Chronic sinusitis, unspecified: J32.9

## 2015-05-11 LAB — CBG MONITORING, ED: Glucose-Capillary: 148 mg/dL — ABNORMAL HIGH (ref 70–99)

## 2015-05-11 MED ORDER — SODIUM CHLORIDE 0.9 % IV BOLUS (SEPSIS)
1000.0000 mL | INTRAVENOUS | Status: AC
  Administered 2015-05-11: 1000 mL via INTRAVENOUS

## 2015-05-11 MED ORDER — PREDNISONE 20 MG PO TABS: ORAL_TABLET | ORAL | Status: AC

## 2015-05-11 MED ORDER — METHYLPREDNISOLONE SODIUM SUCC 125 MG IJ SOLR
125.0000 mg | Freq: Once | INTRAMUSCULAR | Status: AC
Start: 2015-05-11 — End: 2015-05-11
  Administered 2015-05-11: 125 mg via INTRAVENOUS

## 2015-05-11 NOTE — ED Notes (Signed)
Pt reports "sinus issue" that causes seizures.  Pt reports that he has been dealing w/ "issue" x 8 years.  STS "we've been waiting on ENT to order a CT.  I ran out of my antibiotics, so everything filled back up.  It causes me to have a seizure."  Pt is aware when seizures are going to happen.

## 2015-05-11 NOTE — ED Notes (Addendum)
Pt arrived via EMS and was noted by pt's family to be having a "full seizure". Pt does have a hx of seizure. No incontinence noted. Pt does have some minor trauma of tongue due to seizure. Pt is now A/Ox4. Pt does not have medication for seizures, as they are related to a sinus abnormality that he takes ABT for. Pt has not been taking his ABT as prescribed. CBG for EMS 105

## 2015-05-11 NOTE — ED Notes (Signed)
Patient has a history of seizures and had a seizure today.  Father called EMS.   States he remembers laying down in his bed because he knew he was going to have a seizure

## 2015-05-11 NOTE — ED Notes (Signed)
Pt escorted to discharge window. Verbalized understanding discharge instructions. In no acute distress.  Pt educated to call ENT for follow-up.

## 2015-05-11 NOTE — ED Provider Notes (Signed)
CSN: [id number]     Arrival date & time 05/11/15  1412 History   First MD Initiated Contact with Patient 05/11/15 1420     Chief Complaint  Patient presents with  . Seizures     (Consider location/radiation/quality/duration/timing/severity/associated sxs/prior Treatment) Patient is a 24 y.o. male presenting with seizures. The history is provided by the patient.  Seizures Seizure activity on arrival: no   Seizure type:  Tonic Preceding symptoms: aura   Initial focality:  Unable to specify Episode characteristics: stiffening, tongue biting and unresponsiveness   Return to baseline: yes   Severity:  Mild Duration:  2 minutes Timing:  Once Number of seizures this episode:  1 Progression:  Resolved Recent head injury:  No recent head injuries PTA treatment:  None History of seizures: yes     Past Medical History  Diagnosis Date  . Allergy 2010  . Asthma 2008  . Seizures   . Recurrent sinusitis   . Sinusitis nasal    Past Surgical History  Procedure Laterality Date  . Tonsillectomy  11/2009   Family History  Problem Relation Age of Onset  . Depression Mother   . Early death Mother   . Asthma Father   . Mental illness Father     PTSD  . Asthma Brother   . Birth defects Brother   . Arthritis Maternal Grandmother   . Birth defects Maternal Grandmother   . Diabetes Maternal Grandmother   . Hyperlipidemia Maternal Grandmother   . Hypertension Maternal Grandmother   . Vision loss Maternal Grandmother   . Diabetes Maternal Grandfather   . Hypertension Maternal Grandfather   . Vision loss Maternal Grandfather   . Cancer Paternal Grandmother   . Depression Paternal Grandmother   . Diabetes Paternal Grandmother   . Hyperlipidemia Paternal Grandmother   . Hypertension Paternal Grandmother   . Stroke Paternal Grandmother   . Vision loss Paternal Grandmother   . Depression Paternal Grandfather   . Diabetes Paternal Grandfather   . Hypertension Paternal Grandfather   .  Vision loss Paternal Grandfather    History  Substance Use Topics  . Smoking status: Former Smoker    Quit date: 02/20/2015  . Smokeless tobacco: Current User    Types: Chew  . Alcohol Use: Yes     Comment: 6 mixed drinks a week    Review of Systems  Constitutional: Negative for fever.  HENT: Negative for drooling and rhinorrhea.   Eyes: Negative for pain.  Respiratory: Negative for cough and shortness of breath.   Cardiovascular: Negative for chest pain and leg swelling.  Gastrointestinal: Negative for nausea, vomiting, abdominal pain and diarrhea.  Genitourinary: Negative for dysuria and hematuria.  Musculoskeletal: Negative for gait problem and neck pain.  Skin: Negative for color change.  Neurological: Positive for seizures. Negative for numbness and headaches.  Hematological: Negative for adenopathy.  Psychiatric/Behavioral: Negative for behavioral problems.  All other systems reviewed and are negative.     Allergies  Asa and Ibuprofen  Home Medications   Prior to Admission medications   Medication Sig Start Date End Date Taking? Authorizing Provider  albuterol (PROVENTIL HFA;VENTOLIN HFA) 108 (90 BASE) MCG/ACT inhaler Inhale 2 puffs into the lungs every 6 (six) hours as needed for wheezing or shortness of breath. 03/08/15  Yes Donita Brooks, MD  budesonide-formoterol [facility]) 160-4.5 MCG/ACT inhaler Inhale 2 puffs into the lungs 2 (two) times daily. 04/14/15  Yes Donita Brooks, MD  fluticasone (FLONASE) 50 MCG/ACT nasal spray Place 2  sprays into both nostrils daily. 03/09/15  Yes Donita BrooksWarren T Pickard, MD  hydrOXYzine (VISTARIL) 25 MG capsule Take 1 capsule by mouth 4 (four) times daily as needed for itching.  03/20/15  Yes Historical Provider, MD  ranitidine (ZANTAC) 150 MG tablet Take 1 tablet by mouth 2 (two) times daily as needed (itching).  03/20/15  Yes Historical Provider, MD  amoxicillin-clavulanate (AUGMENTIN) 875-125 MG per tablet Take 1 tablet by mouth 2 (two)  times daily. Patient not taking: Reported on 05/11/2015 04/14/15   Donita BrooksWarren T Pickard, MD  azelastine (ASTELIN) 0.1 % nasal spray Place 2 sprays into both nostrils 2 (two) times daily. Use in each nostril as directed Patient not taking: Reported on 05/11/2015 03/09/15   Donita BrooksWarren T Pickard, MD  predniSONE (DELTASONE) 20 MG tablet Take 2 tablets (40 mg total) by mouth daily with breakfast. Patient not taking: Reported on 05/11/2015 04/14/15   Donita BrooksWarren T Pickard, MD   BP 117/71 mmHg  Pulse 110  Temp(Src) 97.6 F (36.4 C) (Oral)  Ht 6\' 2"  (1.88 m)  Wt 243 lb (110.224 kg)  BMI 31.19 kg/m2  SpO2 96% Physical Exam  Constitutional: He is oriented to person, place, and time. He appears well-developed and well-nourished.  HENT:  Head: Normocephalic and atraumatic.  Right Ear: External ear normal.  Left Ear: External ear normal.  Nose: Nose normal.  Mouth/Throat: Oropharynx is clear and moist. No oropharyngeal exudate.  Abrasions on the lateral aspect of his tongue bilaterally. No obvious laceration.  Eyes: Conjunctivae and EOM are normal. Pupils are equal, round, and reactive to light.  Neck: Normal range of motion. Neck supple.  Cardiovascular: Normal rate, regular rhythm, normal heart sounds and intact distal pulses.  Exam reveals no gallop and no friction rub.   No murmur heard. Pulmonary/Chest: Effort normal and breath sounds normal. No respiratory distress. He has no wheezes.  Abdominal: Soft. Bowel sounds are normal. He exhibits no distension. There is no tenderness. There is no rebound and no guarding.  Musculoskeletal: Normal range of motion. He exhibits no edema or tenderness.  Neurological: He is alert and oriented to person, place, and time.  alert, oriented x3 speech: normal in context and clarity memory: intact grossly cranial nerves II-XII: intact motor strength: full proximally and distally no involuntary movements or tremors sensation: intact to light touch diffusely  cerebellar:  finger-to-nose and heel-to-shin intact gait: normal forwards and backwards  Skin: Skin is warm and dry.  Psychiatric: He has a normal mood and affect. His behavior is normal.  Nursing note and vitals reviewed.   ED Course  Procedures (including critical care time) Labs Review Labs Reviewed  CBG MONITORING, ED - Abnormal; Notable for the following:    Glucose-Capillary 148 (*)    All other components within normal limits    Imaging Review No results found.   EKG Interpretation   Date/Time:  Wednesday May 11 2015 14:49:03 EDT Ventricular Rate:  102 PR Interval:  123 QRS Duration: 90 QT Interval:  337 QTC Calculation: 439 R Axis:   34 Text Interpretation:  Sinus tachycardia ST elev, probable normal early  repol pattern Baseline wander in lead(s) V2 V5 Confirmed by Nanda Bittick  MD,  Harout Scheurich ([id number]) on 05/11/2015 2:51:42 PM      MDM   Final diagnoses:  Seizure    2:52 PM 24 y.o. male with a history of seizures and chronic sinusitis who presents with a seizure which occurred today. The patient states that he was previously on Depakote but was  discontinued several years ago. He has since developed chronic sinusitis which is believed to be related to his seizures. He is not currently on seizure medication but has a tonic seizure about once every 4-5 months. He states that he had an aura about 2 hours prior to the seizure today and was lying on his bed when it occurred. He believes it lasted about 2 minutes. This happened at around 1 PM today. He is currently asymptomatic and has a normal neurologic exam. He has established with a local primary care doctor and been referred to ENT (Dr. Jenne PaneBates per pt) for nasal sinus surgery. He completed a course of Augmentin in April and was last on steroids a few weeks ago. He notes that he typically has significant relief after steroids. We'll give him some IV fluids and Solu-Medrol as well. He is mildly tachycardic, vital signs otherwise unremarkable  here.  4:20 PM: Pt continues to appear well. HR dec w IVF. I called his ENT's office and spoke w/ a nurse who stated that the pt will be called soon to arrange surgery.  As seizures are suspected to be related to sinusitis flares, will place on steroids for a few days. Do not think resuming seizure medications will be needed unless seizures become more frequent as pt has a pcp and plans for ENT surgery already. I have discussed the diagnosis/risks/treatment options with the patient and believe the pt to be eligible for discharge home to follow-up with Dr. Jenne PaneBates. We also discussed returning to the ED immediately if new or worsening sx occur. We discussed the sx which are most concerning (e.g., further or worsening seizures, fever) that necessitate immediate return. Medications administered to the patient during their visit and any new prescriptions provided to the patient are listed below.  Medications given during this visit Medications  sodium chloride 0.9 % bolus 1,000 mL (1,000 mLs Intravenous New Bag/Given 05/11/15 1446)  methylPREDNISolone sodium succinate (SOLU-MEDROL) 125 mg/2 mL injection 125 mg (125 mg Intravenous Given 05/11/15 1446)    New Prescriptions   PREDNISONE (DELTASONE) 20 MG TABLET    Take 2 tablets by mouth on day 1 and 2. Take 1 tablet by mouth on day 3.     Purvis SheffieldForrest Pahola Dimmitt, MD 05/12/15 [id number]

## 2015-05-11 NOTE — Discharge Instructions (Signed)

## 2015-05-11 NOTE — ED Notes (Signed)
Dr Harrison at bedside

## 2015-05-26 ENCOUNTER — Other Ambulatory Visit (HOSPITAL_COMMUNITY): Payer: Self-pay | Admitting: *Deleted

## 2015-05-26 NOTE — Pre-Procedure Instructions (Signed)
Jason SampleMichael Duffy  05/26/2015    Your procedure is scheduled on Friday, June 03, 2015 at 12:30 PM.   Report to [facility]  Entrance "A" Admitting Office at 10:30 AM.   Call this number if you have problems the morning of surgery: [phone]              Any questions prior to day of surgery, please call [phone] between 8 & 4 PM.                Remember:  Do not eat food or drink liquids after midnight Thursday, 06/02/15.  Take these medicines the morning of surgery with A SIP OF WATER:  Symbicort inhaler, Flonase, Albuterol inhaler-if needed, Zantac - if needed   Do not wear jewelry.  Do not wear lotions, powders, or cologne.  You may wear deodorant.  Men may shave face and neck.  Do not bring valuables to the hospital.  [facility] is not responsible for any belongings or valuables.  Contacts, dentures or bridgework may not be worn into surgery.  Leave your suitcase in the car.  After surgery it may be brought to your room.  For patients admitted to the hospital, discharge time will be determined by your treatment team.  Special instructions:  [place] - Preparing for Surgery  Before surgery, you can play an important role.  Because skin is not sterile, your skin needs to be as free of germs as possible.  You can reduce the number of germs on you skin by washing with CHG (chlorahexidine gluconate) soap before surgery.  CHG is an antiseptic cleaner which kills germs and bonds with the skin to continue killing germs even after washing.  Please DO NOT use if you have an allergy to CHG or antibacterial soaps.  If your skin becomes reddened/irritated stop using the CHG and inform your nurse when you arrive at Short Stay.  Do not shave (including legs and underarms) for at least 48 hours prior to the first CHG shower.  You may shave your face.  Please follow these instructions carefully:   1.  Shower with CHG Soap the night before surgery and the                                 morning of Surgery.  2.  If you choose to wash your hair, wash your hair first as usual with your       normal shampoo.  3.  After you shampoo, rinse your hair and body thoroughly to remove the                      Shampoo.  4.  Use CHG as you would any other liquid soap.  You can apply chg directly       to the skin and wash gently with scrungie or a clean washcloth.  5.  Apply the CHG Soap to your body ONLY FROM THE NECK DOWN.        Do not use on open wounds or open sores.  Avoid contact with your eyes, ears, mouth and genitals (private parts).  Wash genitals (private parts) with your normal soap.  6.  Wash thoroughly, paying special attention to the area where your surgery        will be performed.  7.  Thoroughly rinse your body with warm water from the neck down.  8.  DO NOT shower/wash with your normal soap after using and rinsing off       the CHG Soap.  9.  Pat yourself dry with a clean towel.            10.  Wear clean pajamas.            11.  Place clean sheets on your bed the night of your first shower and do not        sleep with pets.  Day of Surgery  Do not apply any lotions the morning of surgery.  Please wear clean clothes to the hospital.    Please read over the following fact sheets that you were given. Pain Booklet, Coughing and Deep Breathing and Surgical Site Infection Prevention

## 2015-05-27 ENCOUNTER — Encounter (HOSPITAL_COMMUNITY)
Admission: RE | Admit: 2015-05-27 | Discharge: 2015-05-27 | Disposition: A | Discharge status: Home / Self Care | Payer: 59 | Source: Ambulatory Visit | Attending: Otolaryngology | Admitting: Otolaryngology

## 2015-05-27 ENCOUNTER — Encounter (HOSPITAL_COMMUNITY): Payer: Self-pay

## 2015-05-27 DIAGNOSIS — Z01818 Encounter for other preprocedural examination: Secondary | ICD-10-CM | POA: Diagnosis present

## 2015-05-27 HISTORY — DX: Other seasonal allergic rhinitis: J30.2

## 2015-05-27 HISTORY — DX: Gastro-esophageal reflux disease without esophagitis: K21.9

## 2015-05-27 LAB — CBC
HEMATOCRIT: 42.4 % (ref 39.0–52.0)
Hemoglobin: 14.6 g/dL (ref 13.0–17.0)
MCH: 26.9 pg (ref 26.0–34.0)
MCHC: 34.4 g/dL (ref 30.0–36.0)
MCV: 78.2 fL (ref 78.0–100.0)
Platelets: 223 10*3/uL (ref 150–400)
RBC: 5.42 MIL/uL (ref 4.22–5.81)
RDW: 12.8 % (ref 11.5–15.5)
WBC: 7 10*3/uL (ref 4.0–10.5)

## 2015-05-27 NOTE — Progress Notes (Signed)
Called Dr. Jenne PaneBates office for pre-op orders, left message on VM for Tamber, Dr. Jenne PaneBates' nurse.

## 2015-06-03 ENCOUNTER — Ambulatory Visit (HOSPITAL_COMMUNITY): Payer: 59 | Admitting: Certified Registered Nurse Anesthetist

## 2015-06-03 ENCOUNTER — Encounter (HOSPITAL_COMMUNITY): Payer: Self-pay | Admitting: Certified Registered Nurse Anesthetist

## 2015-06-03 ENCOUNTER — Ambulatory Visit (HOSPITAL_COMMUNITY)
Admission: RE | Admit: 2015-06-03 | Discharge: 2015-06-03 | Disposition: A | Discharge status: Home / Self Care | Payer: 59 | Source: Ambulatory Visit | Attending: Otolaryngology | Admitting: Otolaryngology

## 2015-06-03 ENCOUNTER — Encounter (HOSPITAL_COMMUNITY)
Admission: RE | Disposition: A | Discharge status: Home / Self Care | Payer: Self-pay | Source: Ambulatory Visit | Attending: Otolaryngology

## 2015-06-03 DIAGNOSIS — Z87891 Personal history of nicotine dependence: Secondary | ICD-10-CM | POA: Insufficient documentation

## 2015-06-03 DIAGNOSIS — J322 Chronic ethmoidal sinusitis: Secondary | ICD-10-CM | POA: Diagnosis not present

## 2015-06-03 DIAGNOSIS — J45909 Unspecified asthma, uncomplicated: Secondary | ICD-10-CM | POA: Diagnosis not present

## 2015-06-03 DIAGNOSIS — Z7951 Long term (current) use of inhaled steroids: Secondary | ICD-10-CM | POA: Diagnosis not present

## 2015-06-03 DIAGNOSIS — E669 Obesity, unspecified: Secondary | ICD-10-CM | POA: Diagnosis not present

## 2015-06-03 DIAGNOSIS — J320 Chronic maxillary sinusitis: Secondary | ICD-10-CM | POA: Insufficient documentation

## 2015-06-03 DIAGNOSIS — Z6829 Body mass index (BMI) 29.0-29.9, adult: Secondary | ICD-10-CM | POA: Insufficient documentation

## 2015-06-03 DIAGNOSIS — J338 Other polyp of sinus: Secondary | ICD-10-CM | POA: Insufficient documentation

## 2015-06-03 DIAGNOSIS — J321 Chronic frontal sinusitis: Secondary | ICD-10-CM | POA: Diagnosis not present

## 2015-06-03 DIAGNOSIS — F1099 Alcohol use, unspecified with unspecified alcohol-induced disorder: Secondary | ICD-10-CM | POA: Insufficient documentation

## 2015-06-03 DIAGNOSIS — K219 Gastro-esophageal reflux disease without esophagitis: Secondary | ICD-10-CM | POA: Diagnosis not present

## 2015-06-03 DIAGNOSIS — J323 Chronic sphenoidal sinusitis: Secondary | ICD-10-CM | POA: Insufficient documentation

## 2015-06-03 DIAGNOSIS — Z886 Allergy status to analgesic agent status: Secondary | ICD-10-CM | POA: Insufficient documentation

## 2015-06-03 HISTORY — PX: MAXILLARY ANTROSTOMY: SHX2003

## 2015-06-03 HISTORY — PX: ETHMOIDECTOMY: SHX5197

## 2015-06-03 HISTORY — PX: SINUS ENDO W/FUSION: SHX777

## 2015-06-03 SURGERY — SINUS SURGERY, ENDOSCOPIC, USING COMPUTER-ASSISTED NAVIGATION
Anesthesia: General | Site: Nose | Laterality: Bilateral

## 2015-06-03 MED ORDER — GLYCOPYRROLATE 0.2 MG/ML IJ SOLN
INTRAMUSCULAR | Status: DC | PRN
  Administered 2015-06-03: 0.2 mg via INTRAVENOUS

## 2015-06-03 MED ORDER — LIDOCAINE HCL (CARDIAC) 20 MG/ML IV SOLN
INTRAVENOUS | Status: AC
  Filled 2015-06-03: qty 5

## 2015-06-03 MED ORDER — OXYMETAZOLINE HCL 0.05 % NA SOLN
NASAL | Status: DC | PRN
  Administered 2015-06-03: 2 via NASAL

## 2015-06-03 MED ORDER — PROMETHAZINE HCL 25 MG/ML IJ SOLN
INTRAMUSCULAR | Status: AC
  Filled 2015-06-03: qty 1

## 2015-06-03 MED ORDER — ALBUTEROL SULFATE HFA 108 (90 BASE) MCG/ACT IN AERS
INHALATION_SPRAY | RESPIRATORY_TRACT | Status: DC | PRN
  Administered 2015-06-03: 2 via RESPIRATORY_TRACT

## 2015-06-03 MED ORDER — PROPOFOL 10 MG/ML IV BOLUS
INTRAVENOUS | Status: DC | PRN
  Administered 2015-06-03: 180 mg via INTRAVENOUS

## 2015-06-03 MED ORDER — AMOXICILLIN-POT CLAVULANATE 875-125 MG PO TABS: 1.0000 | ORAL_TABLET | Freq: Two times a day (BID) | ORAL | Status: DC

## 2015-06-03 MED ORDER — GLYCOPYRROLATE 0.2 MG/ML IJ SOLN
INTRAMUSCULAR | Status: AC
  Filled 2015-06-03: qty 3

## 2015-06-03 MED ORDER — MUPIROCIN 2 % EX OINT
TOPICAL_OINTMENT | CUTANEOUS | Status: DC | PRN
  Administered 2015-06-03: 1 via NASAL

## 2015-06-03 MED ORDER — ALBUTEROL SULFATE HFA 108 (90 BASE) MCG/ACT IN AERS
INHALATION_SPRAY | RESPIRATORY_TRACT | Status: AC
Start: 2015-06-03 — End: 2015-06-03
  Filled 2015-06-03: qty 6.7

## 2015-06-03 MED ORDER — HYDROMORPHONE HCL 1 MG/ML IJ SOLN
0.2500 mg | INTRAMUSCULAR | Status: DC | PRN
  Administered 2015-06-03 (×2): 0.5 mg via INTRAVENOUS

## 2015-06-03 MED ORDER — ONDANSETRON HCL 4 MG/2ML IJ SOLN
INTRAMUSCULAR | Status: DC | PRN
  Administered 2015-06-03: 4 mg via INTRAVENOUS

## 2015-06-03 MED ORDER — FENTANYL CITRATE (PF) 100 MCG/2ML IJ SOLN
INTRAMUSCULAR | Status: DC | PRN
  Administered 2015-06-03: 100 ug via INTRAVENOUS
  Administered 2015-06-03: 150 ug via INTRAVENOUS

## 2015-06-03 MED ORDER — ARTIFICIAL TEARS OP OINT
TOPICAL_OINTMENT | OPHTHALMIC | Status: DC | PRN
  Administered 2015-06-03: 1 via OPHTHALMIC

## 2015-06-03 MED ORDER — MIDAZOLAM HCL 5 MG/5ML IJ SOLN
INTRAMUSCULAR | Status: DC | PRN
  Administered 2015-06-03: 2 mg via INTRAVENOUS

## 2015-06-03 MED ORDER — ROCURONIUM BROMIDE 50 MG/5ML IV SOLN
INTRAVENOUS | Status: AC
  Filled 2015-06-03: qty 1

## 2015-06-03 MED ORDER — METHYLPREDNISOLONE SODIUM SUCC 125 MG IJ SOLR
INTRAMUSCULAR | Status: DC | PRN
  Administered 2015-06-03: 100 mg via INTRAVENOUS

## 2015-06-03 MED ORDER — PROMETHAZINE HCL 25 MG/ML IJ SOLN
6.2500 mg | INTRAMUSCULAR | Status: DC | PRN
  Administered 2015-06-03: 6.25 mg via INTRAVENOUS

## 2015-06-03 MED ORDER — SODIUM CHLORIDE 0.9 % IR SOLN
Status: DC | PRN
  Administered 2015-06-03 (×2): 1000 mL

## 2015-06-03 MED ORDER — FENTANYL CITRATE (PF) 250 MCG/5ML IJ SOLN
INTRAMUSCULAR | Status: AC
Start: 2015-06-03 — End: 2015-06-03
  Filled 2015-06-03: qty 5

## 2015-06-03 MED ORDER — ARTIFICIAL TEARS OP OINT
TOPICAL_OINTMENT | OPHTHALMIC | Status: AC
  Filled 2015-06-03: qty 3.5

## 2015-06-03 MED ORDER — MUPIROCIN 2 % EX OINT
TOPICAL_OINTMENT | CUTANEOUS | Status: AC
  Filled 2015-06-03: qty 22

## 2015-06-03 MED ORDER — SODIUM CHLORIDE 0.9 % IV SOLN
3.0000 g | INTRAVENOUS | Status: AC
  Administered 2015-06-03: 3 g via INTRAVENOUS
  Filled 2015-06-03: qty 3

## 2015-06-03 MED ORDER — ROCURONIUM BROMIDE 100 MG/10ML IV SOLN
INTRAVENOUS | Status: DC | PRN
  Administered 2015-06-03: 40 mg via INTRAVENOUS

## 2015-06-03 MED ORDER — LIDOCAINE-EPINEPHRINE 1 %-1:100000 IJ SOLN
INTRAMUSCULAR | Status: DC | PRN
  Administered 2015-06-03: 30 mL

## 2015-06-03 MED ORDER — ONDANSETRON HCL 4 MG/2ML IJ SOLN
INTRAMUSCULAR | Status: AC
  Filled 2015-06-03: qty 2

## 2015-06-03 MED ORDER — MIDAZOLAM HCL 2 MG/2ML IJ SOLN
INTRAMUSCULAR | Status: AC
Start: 2015-06-03 — End: 2015-06-03
  Filled 2015-06-03: qty 2

## 2015-06-03 MED ORDER — OXYMETAZOLINE HCL 0.05 % NA SOLN
NASAL | Status: AC
  Filled 2015-06-03: qty 30

## 2015-06-03 MED ORDER — LIDOCAINE-EPINEPHRINE 1 %-1:100000 IJ SOLN
INTRAMUSCULAR | Status: AC
  Filled 2015-06-03: qty 1

## 2015-06-03 MED ORDER — OXYMETAZOLINE HCL 0.05 % NA SOLN
NASAL | Status: DC | PRN
  Administered 2015-06-03: 1

## 2015-06-03 MED ORDER — LIDOCAINE-EPINEPHRINE 2 %-1:100000 IJ SOLN
INTRAMUSCULAR | Status: AC
  Filled 2015-06-03: qty 1

## 2015-06-03 MED ORDER — LACTATED RINGERS IV SOLN
INTRAVENOUS | Status: DC
  Administered 2015-06-03 (×2): via INTRAVENOUS

## 2015-06-03 MED ORDER — LIDOCAINE HCL (CARDIAC) 20 MG/ML IV SOLN
INTRAVENOUS | Status: DC | PRN
  Administered 2015-06-03: 20 mg via INTRAVENOUS
  Administered 2015-06-03: 60 mg via INTRAVENOUS

## 2015-06-03 MED ORDER — HYDROCORTISONE NA SUCCINATE PF 100 MG IJ SOLR
INTRAMUSCULAR | Status: AC
  Filled 2015-06-03: qty 2

## 2015-06-03 MED ORDER — NEOSTIGMINE METHYLSULFATE 10 MG/10ML IV SOLN
INTRAVENOUS | Status: DC | PRN
  Administered 2015-06-03: 2 mg via INTRAVENOUS

## 2015-06-03 MED ORDER — NEOSTIGMINE METHYLSULFATE 10 MG/10ML IV SOLN
INTRAVENOUS | Status: AC
  Filled 2015-06-03: qty 1

## 2015-06-03 MED ORDER — HYDROMORPHONE HCL 1 MG/ML IJ SOLN
INTRAMUSCULAR | Status: DC
Start: 2015-06-03 — End: 2015-06-03
  Filled 2015-06-03: qty 1

## 2015-06-03 MED ORDER — PREDNISONE 10 MG PO TABS: 40.0000 mg | ORAL_TABLET | Freq: Every day | ORAL | Status: DC

## 2015-06-03 MED ORDER — PROPOFOL 10 MG/ML IV BOLUS
INTRAVENOUS | Status: AC
Start: 2015-06-03 — End: 2015-06-03
  Filled 2015-06-03: qty 20

## 2015-06-03 MED ORDER — HYDROCODONE-ACETAMINOPHEN 5-325 MG PO TABS: 1.0000 | ORAL_TABLET | Freq: Four times a day (QID) | ORAL | Status: DC | PRN

## 2015-06-03 SURGICAL SUPPLY — 45 items
BLADE RAD40 ROTATE 4M 4 5PK (BLADE) IMPLANT
BLADE RAD40 ROTATE 4M 4MM 5PK (BLADE)
BLADE RAD60 ROTATE M4 4 5PK (BLADE) ×2 IMPLANT
BLADE RAD60 ROTATE M4 4MM 5PK (BLADE) ×1
BLADE ROTATE TRICUT 4MX13CM M4 (BLADE) ×1
BLADE ROTATE TRICUT 4X13 M4 (BLADE) ×2 IMPLANT
BLADE TRICUT ROTATE M4 4 5PK (BLADE) ×2 IMPLANT
BLADE TRICUT ROTATE M4 4MM 5PK (BLADE) ×1
CANISTER SUCTION 2500CC (MISCELLANEOUS) ×6 IMPLANT
CLOSURE STERI-STRIP 1/2X4 (GAUZE/BANDAGES/DRESSINGS) ×1
CLSR STERI-STRIP ANTIMIC 1/2X4 (GAUZE/BANDAGES/DRESSINGS) ×2 IMPLANT
COAGULATOR SUCT 6 FR SWTCH (ELECTROSURGICAL) ×1
COAGULATOR SUCT SWTCH 10FR 6 (ELECTROSURGICAL) ×2 IMPLANT
CONT SPEC 4OZ CLIKSEAL STRL BL (MISCELLANEOUS) ×3 IMPLANT
COVER SURGICAL LIGHT HANDLE (MISCELLANEOUS) ×3 IMPLANT
CRADLE DONUT ADULT HEAD (MISCELLANEOUS) ×3 IMPLANT
DRESSING NASAL POPE 10X1.5X2.5 (GAUZE/BANDAGES/DRESSINGS) IMPLANT
DRSG NASAL POPE 10X1.5X2.5 (GAUZE/BANDAGES/DRESSINGS)
DRSG NASOPORE 8CM (GAUZE/BANDAGES/DRESSINGS) ×3 IMPLANT
ELECT REM PT RETURN 9FT ADLT (ELECTROSURGICAL) ×3
ELECTRODE REM PT RTRN 9FT ADLT (ELECTROSURGICAL) ×1 IMPLANT
GLOVE BIO SURGEON STRL SZ7.5 (GLOVE) ×3 IMPLANT
GLOVE ECLIPSE 7.0 STRL STRAW (GLOVE) ×3 IMPLANT
GOWN STRL REUS W/ TWL LRG LVL3 (GOWN DISPOSABLE) ×2 IMPLANT
GOWN STRL REUS W/TWL LRG LVL3 (GOWN DISPOSABLE) ×4
KIT BASIN OR (CUSTOM PROCEDURE TRAY) ×3 IMPLANT
KIT ROOM TURNOVER OR (KITS) ×3 IMPLANT
NEEDLE 27GAX1X1/2 (NEEDLE) ×3 IMPLANT
NEEDLE HYPO 25GX1X1/2 BEV (NEEDLE) IMPLANT
NEEDLE SPNL 22GX3.5 QUINCKE BK (NEEDLE) ×3 IMPLANT
NS IRRIG 1000ML POUR BTL (IV SOLUTION) ×3 IMPLANT
PAD ARMBOARD 7.5X6 YLW CONV (MISCELLANEOUS) ×6 IMPLANT
PAD ENT ADHESIVE 25PK (MISCELLANEOUS) ×3 IMPLANT
PATTIES SURGICAL .5 X3 (DISPOSABLE) ×3 IMPLANT
SHEATH ENDOSCRUB 0 DEG (SHEATH) ×3 IMPLANT
SHEATH ENDOSCRUB 30 DEG (SHEATH) ×3 IMPLANT
SHEATH ENDOSCRUB 45 DEG (SHEATH) ×3 IMPLANT
SOLUTION ANTI FOG 6CC (MISCELLANEOUS) ×3 IMPLANT
SUT ETHILON 3 0 PS 1 (SUTURE) IMPLANT
TOWEL OR 17X24 6PK STRL BLUE (TOWEL DISPOSABLE) ×3 IMPLANT
TRACKER ENT INSTRUMENT (MISCELLANEOUS) ×3 IMPLANT
TRACKER ENT PATIENT (MISCELLANEOUS) ×3 IMPLANT
TRAY ENT MC OR (CUSTOM PROCEDURE TRAY) ×3 IMPLANT
TUBE CONNECTING 12'X1/4 (SUCTIONS) ×1
TUBE CONNECTING 12X1/4 (SUCTIONS) ×2 IMPLANT

## 2015-06-03 NOTE — Anesthesia Preprocedure Evaluation (Addendum)
Anesthesia Evaluation  Patient identified by MRN, date of birth, ID band Patient awake    Reviewed: Allergy & Precautions, NPO status , Patient's Chart, lab work & pertinent test results  Airway Mallampati: II  TM Distance: >3 FB Neck ROM: Full    Dental  (+) Teeth Intact, Dental Advisory Given,    Pulmonary asthma , former smoker,  breath sounds clear to auscultation        Cardiovascular negative cardio ROS  Rhythm:Regular Rate:Normal     Neuro/Psych Seizures -, Well Controlled,  Pt states not medicated for seizures, was told they could be R/T sinus trouble.    GI/Hepatic Neg liver ROS, GERD-  ,  Endo/Other  negative endocrine ROS  Renal/GU negative Renal ROS     Musculoskeletal   Abdominal (+) + obese,   Peds  Hematology   Anesthesia Other Findings   Reproductive/Obstetrics                           Anesthesia Physical Anesthesia Plan  ASA: III  Anesthesia Plan: General   Post-op Pain Management:    Induction: Intravenous  Airway Management Planned: Oral ETT  Additional Equipment:   Intra-op Plan:   Post-operative Plan: Extubation in OR  Informed Consent: I have reviewed the patients History and Physical, chart, labs and discussed the procedure including the risks, benefits and alternatives for the proposed anesthesia with the patient or authorized representative who has indicated his/her understanding and acceptance.   Dental advisory given  Plan Discussed with: CRNA and Anesthesiologist  Anesthesia Plan Comments:         Anesthesia Quick Evaluation

## 2015-06-03 NOTE — Transfer of Care (Signed)
Immediate Anesthesia Transfer of Care Note  Patient: Jason Duffy  Procedure(s) Performed: Procedure(s): ENDOSCOPIC SINUS SURGERY WITH NAVIGATION WITH  BILATERAL SPHENOIDOTOMY WITH TISSUE REMOVAL AND FUSION IMAGE GUIDANCE (Bilateral) ETHMOIDECTOMY - TOTAL BILATERAL FRONTAL RECESS EXPLORATION (Bilateral) MAXILLARY ANTROSTOMY with Tissue Removal (Bilateral)  Patient Location: PACU  Anesthesia Type:General  Level of Consciousness: patient cooperative and responds to stimulation  Airway & Oxygen Therapy: Patient Spontanous Breathing and Patient connected to face mask oxygen  Post-op Assessment: Report given to RN and Post -op Vital signs reviewed and stable  Post vital signs: Reviewed and stable  Last Vitals:  Filed Vitals:   06/03/15 1517  BP:   Pulse:   Temp: 36.4 C  Resp:     Complications: No apparent anesthesia complications

## 2015-06-03 NOTE — H&P (Signed)
Jason Duffy is an 24 y.o. male.   Chief Complaint: Nasal polyps, chronic sinusitis HPI: 24 year old male with diagnosis of nasal polyps and chronic sinusitis since 2006.  He also has severe asthma.  His sinus disease has been treated with steroids and antibiotics repeatedly over the years with limited benefit.  Sinus surgery has been considered since 2011.  He presents for sinus surgery.  Past Medical History  Diagnosis Date  . Allergy 2010  . Asthma 2008  . Seizures   . Recurrent sinusitis   . Sinusitis nasal   . GERD (gastroesophageal reflux disease)     Tums as needed  . Seasonal allergies     Past Surgical History  Procedure Laterality Date  . Tonsillectomy  11/2009    Family History  Problem Relation Age of Onset  . Depression Mother   . Early death Mother   . Asthma Father   . Mental illness Father     PTSD  . Asthma Brother   . Birth defects Brother   . Arthritis Maternal Grandmother   . Birth defects Maternal Grandmother   . Diabetes Maternal Grandmother   . Hyperlipidemia Maternal Grandmother   . Hypertension Maternal Grandmother   . Vision loss Maternal Grandmother   . Diabetes Maternal Grandfather   . Hypertension Maternal Grandfather   . Vision loss Maternal Grandfather   . Cancer Paternal Grandmother   . Depression Paternal Grandmother   . Diabetes Paternal Grandmother   . Hyperlipidemia Paternal Grandmother   . Hypertension Paternal Grandmother   . Stroke Paternal Grandmother   . Vision loss Paternal Grandmother   . Depression Paternal Grandfather   . Diabetes Paternal Grandfather   . Hypertension Paternal Grandfather   . Vision loss Paternal Grandfather    Social History:  reports that he quit smoking about 3 months ago. His smokeless tobacco use includes Chew. He reports that he drinks alcohol. He reports that he does not use illicit drugs.  Allergies:  Allergies  Allergen Reactions  . Asa [Aspirin]   . Ibuprofen     Medications Prior to  Admission  Medication Sig Dispense Refill  . albuterol (PROVENTIL HFA;VENTOLIN HFA) 108 (90 BASE) MCG/ACT inhaler Inhale 2 puffs into the lungs every 6 (six) hours as needed for wheezing or shortness of breath. 1 Inhaler 2  . budesonide-formoterol (SYMBICORT) 160-4.5 MCG/ACT inhaler Inhale 2 puffs into the lungs 2 (two) times daily. 1 Inhaler 3  . albuterol (PROVENTIL) (5 MG/ML) 0.5% nebulizer solution Take 2.5 mg by nebulization every 6 (six) hours as needed for wheezing or shortness of breath.    . fluticasone (FLONASE) 50 MCG/ACT nasal spray Place 2 sprays into both nostrils daily. 16 g 6  . hydrOXYzine (VISTARIL) 25 MG capsule Take 1 capsule by mouth 4 (four) times daily as needed for itching.   0  . ranitidine (ZANTAC) 150 MG tablet Take 1 tablet by mouth 2 (two) times daily as needed (itching).   0    No results found for this or any previous visit (from the past 48 hour(s)). No results found.  Review of Systems  HENT: Positive for congestion.   All other systems reviewed and are negative.   Blood pressure 121/59, pulse 58, temperature 97 F (36.1 C), temperature source Oral, resp. rate 18, height  (1.88 m), weight 103.602 kg (228 lb 6.4 oz), SpO2 98 %. Physical Exam  Constitutional: He is oriented to person, place, and time. He appears well-developed and well-nourished. No distress.  HENT:  Head: Normocephalic and atraumatic.  Right Ear: External ear normal.  Left Ear: External ear normal.  Mouth/Throat: Oropharynx is clear and moist.  Bilateral nasal polyps  Eyes: Conjunctivae and EOM are normal. Pupils are equal, round, and reactive to light.  Neck: Normal range of motion. Neck supple.  Cardiovascular: Normal rate.   Respiratory: Effort normal.  Musculoskeletal: Normal range of motion.  Neurological: He is alert and oriented to person, place, and time. No cranial nerve deficit.  Skin: Skin is warm and dry.  Psychiatric: He has a normal mood and affect. His behavior is  normal. Judgment and thought content normal.     Assessment/Plan Bilateral nasal polyps, pan-sinusitis To OR for bilateral endoscopic sinus surgery.  With severe asthma, we may consider overnight observation after surgery.  Tyson Masin 06/03/2015, 12:22 PM

## 2015-06-03 NOTE — Anesthesia Procedure Notes (Signed)
Procedure Name: Intubation Performed by: Everlene BallsHAYES, Rashada Klontz T Pre-anesthesia Checklist: Patient identified, Timeout performed, Suction available, Patient being monitored and Emergency Drugs available Patient Re-evaluated:Patient Re-evaluated prior to inductionOxygen Delivery Method: Circle system utilized Intubation Type: IV induction Ventilation: Oral airway inserted - appropriate to patient size and Mask ventilation without difficulty Laryngoscope Size: Mac and 3 Grade View: Grade I Tube type: Oral Tube size: 7.5 mm Number of attempts: 1 Airway Equipment and Method: Stylet Placement Confirmation: ETT inserted through vocal cords under direct vision,  breath sounds checked- equal and bilateral and positive ETCO2 Secured at: 22 cm Tube secured with: Tape Dental Injury: Teeth and Oropharynx as per pre-operative assessment

## 2015-06-03 NOTE — Anesthesia Postprocedure Evaluation (Signed)
  Anesthesia Post-op Note  Patient: Jason SampleMichael Prickett  Procedure(s) Performed: Procedure(s): ENDOSCOPIC SINUS SURGERY WITH NAVIGATION AND  BILATERAL SPHENOIDOTOMY WITH TISSUE REMOVAL AND FUSION IMAGE GUIDANCE (Bilateral) TOTAL ETHMOIDECTOMY -  BILATERAL FRONTAL RECESS EXPLORATION (Bilateral) MAXILLARY ANTROSTOMY with Tissue Removal (Bilateral)  Patient Location: PACU  Anesthesia Type:General  Level of Consciousness: awake and alert   Airway and Oxygen Therapy: Patient Spontanous Breathing  Post-op Pain: none  Post-op Assessment: Post-op Vital signs reviewed, Patient's Cardiovascular Status Stable and Respiratory Function Stable  Post-op Vital Signs: Reviewed  Filed Vitals:   06/03/15 1517  BP:   Pulse:   Temp: 36.4 C  Resp:     Complications: No apparent anesthesia complications

## 2015-06-03 NOTE — Brief Op Note (Signed)
06/03/2015  3:22 PM  PATIENT:  Jason Duffy  24 y.o. male  PRE-OPERATIVE DIAGNOSIS:  Chronic Sinusitis  POST-OPERATIVE DIAGNOSIS:  Chronic Sinusitis  PROCEDURE:  Procedure(s): ENDOSCOPIC SINUS SURGERY WITH NAVIGATION WITH  BILATERAL SPHENOIDOTOMY WITH TISSUE REMOVAL AND FUSION IMAGE GUIDANCE (Bilateral) ETHMOIDECTOMY - TOTAL BILATERAL FRONTAL RECESS EXPLORATION (Bilateral) MAXILLARY ANTROSTOMY with Tissue Removal (Bilateral)  SURGEON:  Surgeon(s) and Role:    * Christia Readingwight Trammell Bowden, MD - Primary  PHYSICIAN ASSISTANT:   ASSISTANTS: none   ANESTHESIA:   general  EBL:  Total I/O In: 1800 [I.V.:1800] Out: -   BLOOD ADMINISTERED:none  DRAINS: none   LOCAL MEDICATIONS USED:  LIDOCAINE   SPECIMEN:  Source of Specimen:  right and left sinus contents  DISPOSITION OF SPECIMEN:  PATHOLOGY  COUNTS:  YES  TOURNIQUET:  * No tourniquets in log *  DICTATION: .Other Dictation: Dictation Number [id number]  PLAN OF CARE: Discharge to home after PACU  PATIENT DISPOSITION:  PACU - hemodynamically stable.   Delay start of Pharmacological VTE agent (>24hrs) due to surgical blood loss or risk of bleeding: yes

## 2015-06-04 NOTE — Op Note (Signed)
NAMMarland Kitchen:  [place] Creek[place]HN[place], Jason Duffy             ACCOUNT NO.:  [id number]  MEDICAL RECORD NO.:  [id number]  LOCATION:  [facility]                         FACILITY:  [facility]  PHYSICIAN:  Antony Contraswight D Coden Franchi, MD     DATE OF BIRTH:  09-Apr-1991  DATE OF PROCEDURE:  06/03/2015 DATE OF DISCHARGE:  06/03/2015                              OPERATIVE REPORT   PREOPERATIVE DIAGNOSES: 1. Nasal polyposis. 2. Chronic ethmoid sinusitis. 3. Chronic frontal sinusitis. 4. Chronic maxillary sinusitis. 5. Chronic sphenoid sinusitis.  POSTOPERATIVE DIAGNOSES: 1. Nasal polyposis. 2. Chronic ethmoid sinusitis. 3. Chronic frontal sinusitis. 4. Chronic maxillary sinusitis. 5. Chronic sphenoid sinusitis.  PROCEDURES: 1. Bilateral total ethmoidectomy. 2. Bilateral frontal recess exploration. 3. Bilateral maxillary antrostomy with tissue removal. 4. Bilateral sphenoidotomy with tissue removal. 5. Fusion image guidance.  SURGEON:  Antony Contraswight D Paeton Latouche, MD.  ANESTHESIA:  General endotracheal anesthesia.  COMPLICATIONS:  None.  INDICATION:  The patient is a 24 year old male with a long history of nasal polyps and chronic sinusitis, associated with asthma and allergy to aspirin.  He has been treated with antibiotics and steroids repeatedly over the years with some benefit but no lasting benefit.  He deals with chronic nasal obstruction and lack of smell.  Surgery was suggested in 2011, but he did not pursue it at that time.  He comes now for surgery.  FINDINGS:  There was extensive polypoid changes filling both nasal passages, almost completely and also extending from either side of the middle turbinate with extensive involvement in the middle meatus region and polypoid changes in the maxillary sinuses, sphenoid sinuses, and frontal recesses.  The ethmoid sinuses were also fully involved with polyp disease.  There was obstructed mucus in both sphenoid sinuses as well as both maxillary sinuses.  DESCRIPTION OF PROCEDURE:  The  patient was identified in the holding room.  Informed consent having been obtained including discussion of risks, benefits, and alternatives, the patient was brought to the office suite, put on the table in a supine position.  Anesthesia was induced, and the patient was intubated by the Anesthesia team without difficulty. The patient was given intravenous antibiotics and steroids during the case.  The eyes were lubricated, and the fusion image guidance antenna was placed in standard fashion.  The face was prepped and draped in sterile fashion.  Afrin pledgets were placed on both sides of the nose. The patient was registered to the fusion image guidance system in the standard fashion.  The eyes were taped closed then with Steri-Strips. Right-sided surgery was first performed, and this started with removal of the pledgets.  Polyps filling the nasal passage was then removed using the microdebrider back to where the middle turbinate could be identified.  The uncinate process was turned outward and was removed with the microdebrider as well.  The lateral nasal wall was then injected with 1% lidocaine with 1:100,000 epinephrine.  Dissection was then continued into the middle meatus removing polypoid tissue as well as the ethmoid bulla and the anterior ethmoid cells.  The basal lamella was penetrated, and the posterior ethmoid was also dissected.  Image guidance was used throughout the surgery.  The lateral nasal wall was then inspected with the angled  telescope; and using the image guidance, the natural ostium was able to be identified and was widened posteriorly and inferiorly using the microdebrider.  Soft tissues were also then removed from within the maxillary sinus using a curved microdebrider. At this point, the middle turbinate was lateralized, and the superior turbinate was partially removed with microdebrider.  The sphenoid opening was then identified and then opened superiorly and  laterally widening the opening.  Polypoid tissue formed in the sphenoid sinus was then removed with the microdebrider.  This was done through the ethmoid as well.  The skull base was then followed in a retrograde fashion using an angled telescope and curved microdebrider removing the polypoid tissue and septations from along the skull base and the frontal recess. The frontal recess was then dissected with the microdebrider using image guidance and resulted in a widely patent frontal recess.  Afrin pledgets were placed in the ethmoid cavity.  The same procedure was then carried out in the left side, including removal of nasal polyps, injection of local anesthetic to the lateral nasal wall, removal of polyp in ethmoid septations to the anterior and posterior ethmoid, widening of the sphenoid opening with removal of tissue from within the sphenoid sinus, widening the natural maxillary opening with removal of polypoid tissue from the maxillary sinus as well, retrograde dissection of the skull base using the curved microdebrider to the frontal recess, and careful dissection of the frontal recess.  On the left side, the frontal recess was also dissected using dry forceps.  Also on the left, the middle turbinate was ultimately found to be quite destabilized by polyps and surgery.  So, it was partially removed through cut forceps and microdebrider.  Afrin pledgets were placed in the left side as well. Right-sided pledgets were removed and then any sinuses suctioned.  A little bit of additional debridement was performed.  Half of the nose was packed and coated, and Bactroban ointment was placed in the ethmoid cavity and saturated with saline.  This was similarly done on the left side with a little more work down the frontal recess prior to placement of the pack and saturation.  The nasal passages were then suctioned.  On the left, the posterior root of the middle turbinate started to bleed from  the sphenopalatine branch; and so, this was cauterized with suction cautery.  Afrin pledgets were placed in the posterior nasal passage on the left side and left in for wake up.  The throat was then suctioned, and the patient was returned to Anesthesia for wake up.  He was extubated in the recovery room in stable condition.     Antony Contras, MD     DDB/MEDQ  D:  06/03/2015  T:  06/04/2015  Job:  [id number]

## 2015-06-06 ENCOUNTER — Encounter (HOSPITAL_COMMUNITY): Payer: Self-pay | Admitting: Otolaryngology

## 2015-10-28 ENCOUNTER — Encounter: Payer: Self-pay | Admitting: Family Medicine

## 2015-10-28 ENCOUNTER — Ambulatory Visit (INDEPENDENT_AMBULATORY_CARE_PROVIDER_SITE_OTHER): Payer: 59 | Admitting: Family Medicine

## 2015-10-28 VITALS — BP 100/74 | HR 80 | Temp 98.2°F | Resp 18 | Ht 74.0 in | Wt 238.0 lb

## 2015-10-28 DIAGNOSIS — J328 Other chronic sinusitis: Secondary | ICD-10-CM

## 2015-10-28 DIAGNOSIS — J4540 Moderate persistent asthma, uncomplicated: Secondary | ICD-10-CM | POA: Diagnosis not present

## 2015-10-28 MED ORDER — AZELASTINE-FLUTICASONE 137-50 MCG/ACT NA SUSP: 1.0000 | Freq: Two times a day (BID) | NASAL | Status: DC

## 2015-10-28 MED ORDER — BUDESONIDE-FORMOTEROL FUMARATE 160-4.5 MCG/ACT IN AERO: 2.0000 | INHALATION_SPRAY | Freq: Two times a day (BID) | RESPIRATORY_TRACT | Status: DC

## 2015-10-28 MED ORDER — ALBUTEROL SULFATE HFA 108 (90 BASE) MCG/ACT IN AERS: 2.0000 | INHALATION_SPRAY | Freq: Four times a day (QID) | RESPIRATORY_TRACT | Status: DC | PRN

## 2015-10-28 NOTE — Progress Notes (Signed)
Subjective:    Patient ID: Jason Duffy, male    DOB: October 06, 1991, 24 y.o.   MRN: [id number]  Asthma His past medical history is significant for asthma.  4/16 Please see previous OV.  Patient ran out of the Symbicort. He is no longer on Symbicort. He never started Dymista due to insurance issues. We did call in a prescription for Flonase and Astelin but the patient never started that. Shortly after discontinuing the antibiotics and steroids the symptoms resumed with severity.  His previous ENT physician has discharged him as a patient due to noncompliance, missed appointments. He is scheduled to see a new ENT physician however that appointment is pending. Today on exam both nasal passages are completely obstructed swollen nasal turbinates, thick white is mcus, and nasal polyp.  Patient is also audible wheezing on exam.  AT that time, my plan was: This is chronic sinusitis that is unresolved and his acutely worsened. I will place the patient on prednisone 40 mg a day for 2 weeks., Continue the patient on Augmentin 875 mg by mouth twice a day for 3 weeks. Hopefully this will clear the chronic sinusitis. I refilled his Symbicort. I admonished the patient instructed him he needs to be compliant and use both the Flonase and Astelin to help prevent this from returning. At the present time his nasal passages or so obstructed they will not work area and hopefully after the prednisone and antibiotics however, he can resume his nasal sprays.  10/28/15  patient eventually underwent sinus surgery under the care of Dr. Jenne Pane. 2 months after the surgery he was doing great but he lost his insurance. Subsequently he discontinued dymista and symbicort.   Now he is wheezing and using his albuterol 3 or 4 times a week. He also has severe rhinorrhea and sinus congestion. He has been treating this with albuterol and over-the-counter Zyrtec Past Medical History  Diagnosis Date  . Allergy 2010  . Asthma 2008  .  Seizures (HCC)   . Recurrent sinusitis   . Sinusitis nasal   . GERD (gastroesophageal reflux disease)     Tums as needed  . Seasonal allergies    Past Surgical History  Procedure Laterality Date  . Tonsillectomy  11/2009  . Sinus endo w/fusion Bilateral 06/03/2015    Procedure: ENDOSCOPIC SINUS SURGERY WITH NAVIGATION AND  BILATERAL SPHENOIDOTOMY WITH TISSUE REMOVAL AND FUSION IMAGE GUIDANCE;  Surgeon: Christia Reading, MD;  Location: [facility] OR;  Service: ENT;  Laterality: Bilateral;  . Ethmoidectomy Bilateral 06/03/2015    Procedure: TOTAL ETHMOIDECTOMY -  BILATERAL FRONTAL RECESS EXPLORATION;  Surgeon: Christia Reading, MD;  Location: [facility] OR;  Service: ENT;  Laterality: Bilateral;  . Maxillary antrostomy Bilateral 06/03/2015    Procedure: MAXILLARY ANTROSTOMY with Tissue Removal;  Surgeon: Christia Reading, MD;  Location: [facility] OR;  Service: ENT;  Laterality: Bilateral;   Current Outpatient Prescriptions on File Prior to Visit  Medication Sig Dispense Refill  . albuterol (PROVENTIL) (5 MG/ML) 0.5% nebulizer solution Take 2.5 mg by nebulization every 6 (six) hours as needed for wheezing or shortness of breath.     No current facility-administered medications on file prior to visit.   Allergies  Allergen Reactions  . Asa [Aspirin]   . Ibuprofen    Social History   Social History  . Marital Status: Single    Spouse Name: N/A  . Number of Children: N/A  . Years of Education: N/A   Occupational History  . Not on file.   Social  History Main Topics  . Smoking status: Former Smoker    Quit date: 02/20/2015  . Smokeless tobacco: Current User    Types: Chew  . Alcohol Use: Yes     Comment: 6 mixed drinks a week  . Drug Use: No  . Sexual Activity: Yes    Birth Control/ Protection: Condom   Other Topics Concern  . Not on file   Social History Narrative     Review of Systems  All other systems reviewed and are negative.      Objective:   Physical Exam  Constitutional: He appears  well-developed and well-nourished. No distress.  HENT:  Right Ear: Tympanic membrane and ear canal normal.  Left Ear: Tympanic membrane and ear canal normal.  Nose: Mucosal edema and rhinorrhea present. Right sinus exhibits maxillary sinus tenderness and frontal sinus tenderness. Left sinus exhibits maxillary sinus tenderness and frontal sinus tenderness.  Mouth/Throat: Oropharynx is clear and moist.  Eyes: Conjunctivae are normal. Pupils are equal, round, and reactive to light.  Neck: Neck supple.  Cardiovascular: Normal rate, regular rhythm, normal heart sounds and intact distal pulses.   No murmur heard. Pulmonary/Chest: Effort normal. No respiratory distress. He has wheezes. He has no rales.  Lymphadenopathy:    He has no cervical adenopathy.  Skin: He is not diaphoretic.  Vitals reviewed.         Assessment & Plan:  Other chronic sinusitis - Plan: Azelastine-Fluticasone 137-50 MCG/ACT SUSP, DISCONTINUED: Azelastine-Fluticasone 137-50 MCG/ACT SUSP  Asthma, moderate persistent, uncomplicated - Plan: budesonide-formoterol (SYMBICORT) 160-4.5 MCG/ACT inhaler, albuterol (PROVENTIL HFA;VENTOLIN HFA) 108 (90 BASE) MCG/ACT inhaler, DISCONTINUED: budesonide-formoterol (SYMBICORT) 160-4.5 MCG/ACT inhaler, DISCONTINUED: albuterol (PROVENTIL HFA;VENTOLIN HFA) 108 (90 BASE) MCG/ACT inhaler   resumed Symbicort 160/4.5, 2 puffs inhaled twice a day. Continue Zyrtec. Supplement Zyrtec with dymista  1 spray each nostril twice daily. 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 I recommended treating this with Lotrimin cream

## 2015-11-03 ENCOUNTER — Other Ambulatory Visit: Payer: Self-pay | Admitting: *Deleted

## 2015-11-03 DIAGNOSIS — J328 Other chronic sinusitis: Secondary | ICD-10-CM

## 2015-11-03 MED ORDER — AZELASTINE-FLUTICASONE 137-50 MCG/ACT NA SUSP: 1.0000 | Freq: Two times a day (BID) | NASAL | Status: DC

## 2015-11-03 NOTE — Telephone Encounter (Signed)
Received fax requesting refill on Dymista.   Refill appropriate and filled per protocol.

## 2015-11-15 ENCOUNTER — Telehealth: Payer: Self-pay | Admitting: Family Medicine

## 2015-11-15 MED ORDER — FLUTICASONE PROPIONATE 50 MCG/ACT NA SUSP: 2.0000 | Freq: Two times a day (BID) | NASAL | Status: DC

## 2015-11-15 MED ORDER — AZELASTINE HCL 0.1 % NA SOLN: 2.0000 | Freq: Two times a day (BID) | NASAL | Status: DC

## 2015-11-15 NOTE — Telephone Encounter (Signed)
Insurance requesting PA for [employer] - will send in for flonase and astelin separately so ins will cover. Meds sent to pharm.

## 2015-11-19 IMAGING — CR DG CHEST 1V PORT
1 series · 2 of 2 positions shown · non-contrast
Comparison: Chest radiograph from 09/11/2014

CLINICAL DATA: Endotracheal tube placement.

EXAM:
PORTABLE CHEST - 1 VIEW

[Series 1: ap · 0.17mm/px · 2 of 2 slices shown]
[im 1/2]
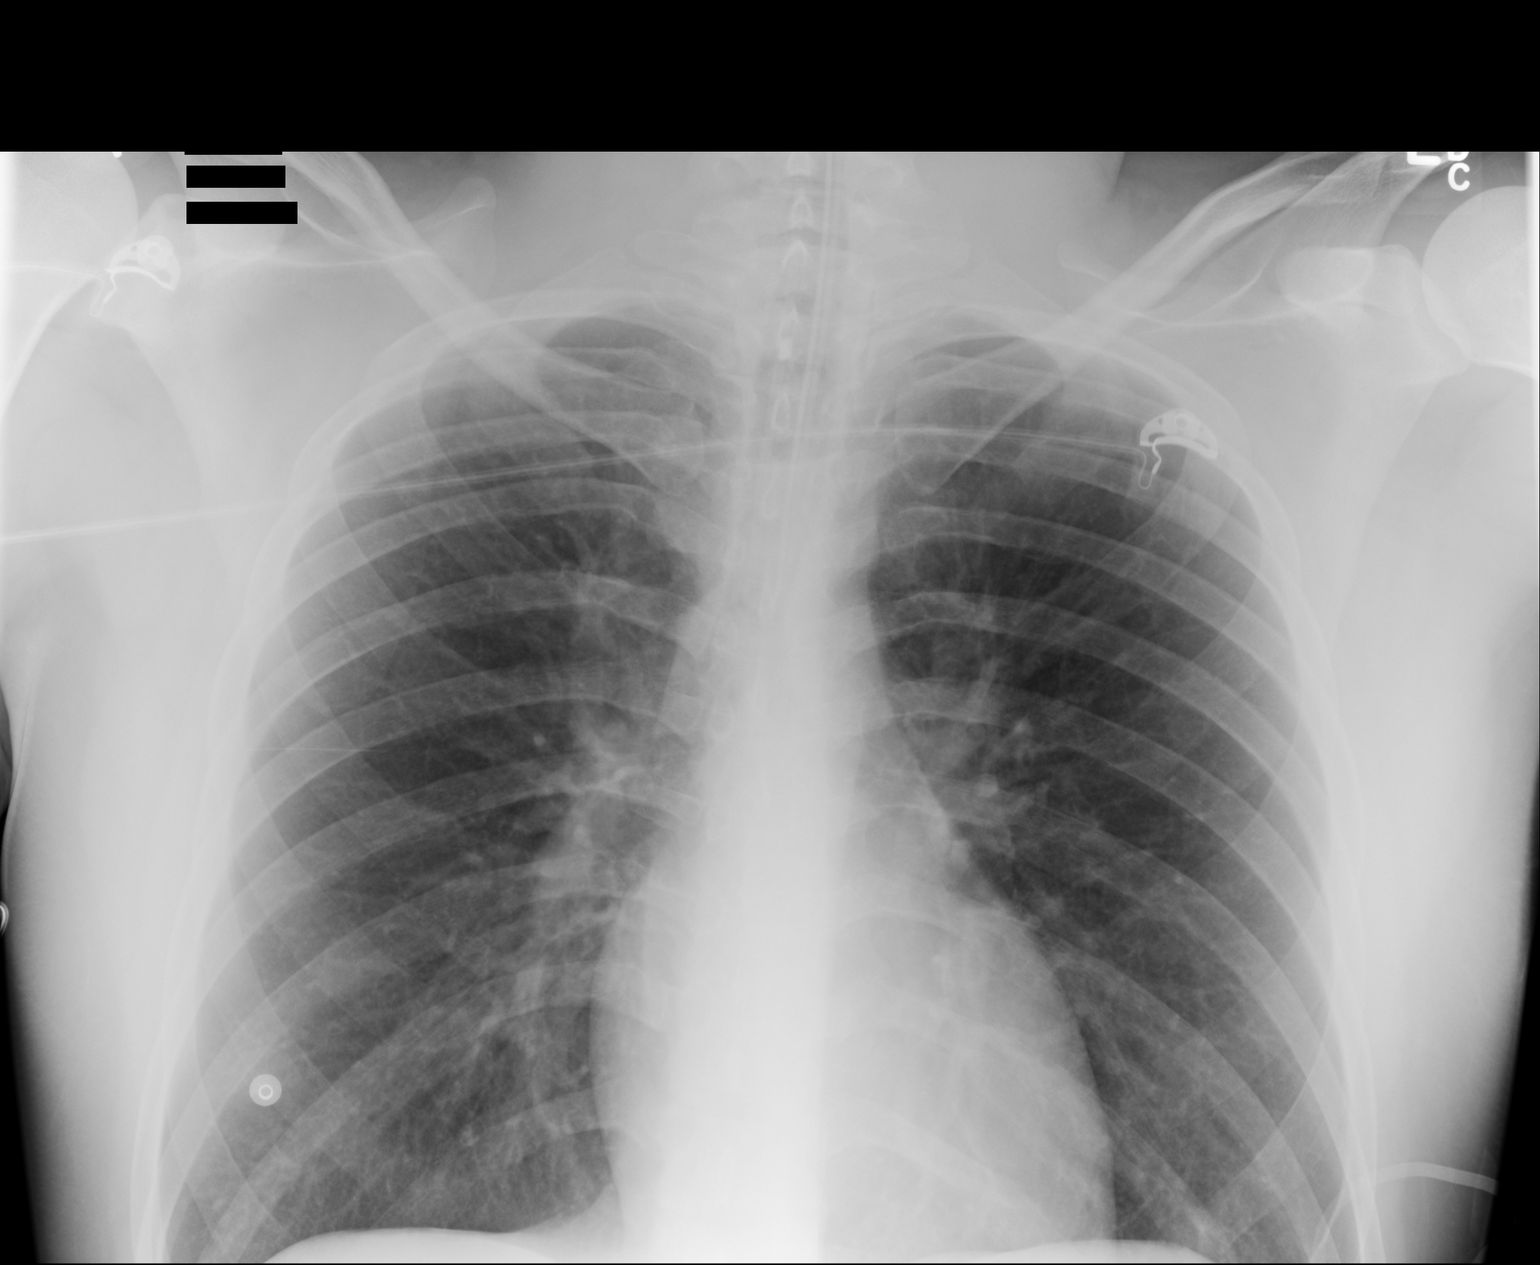
[im 2/2]
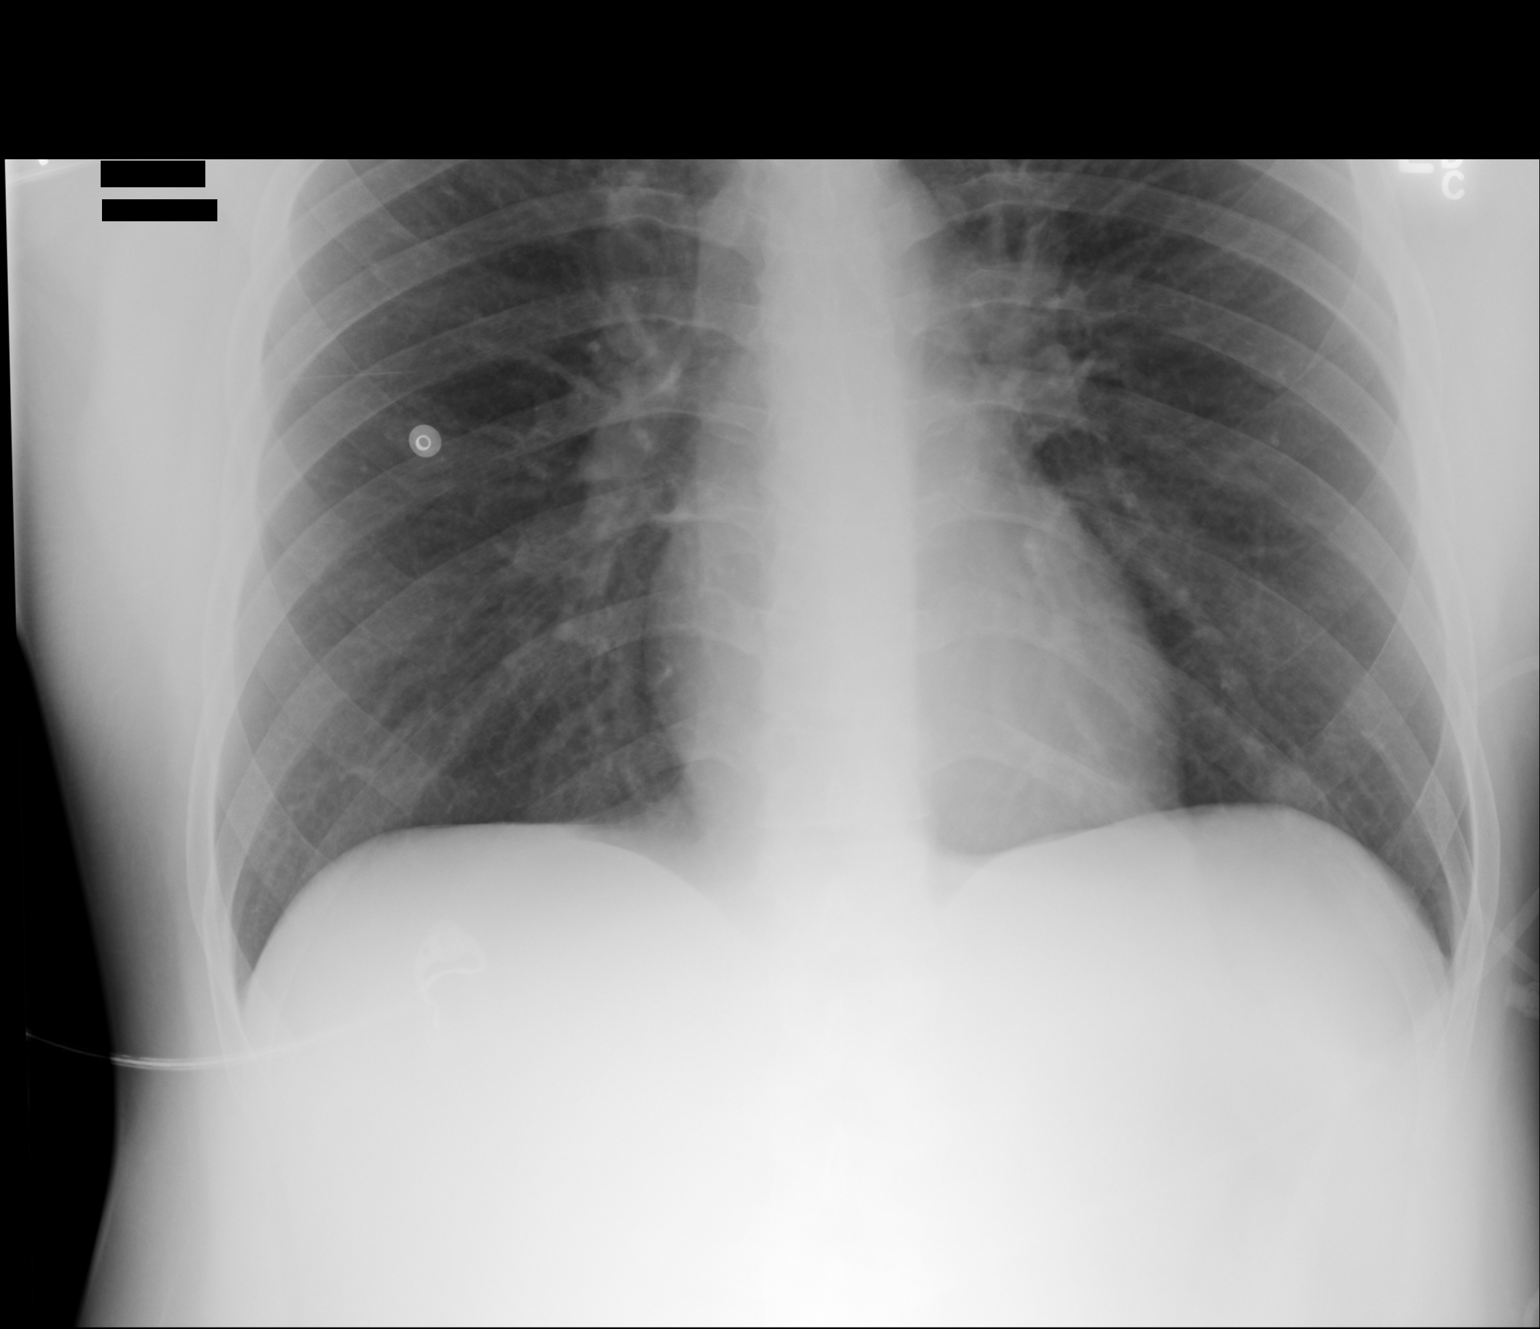

[2 of 2 positions shown; findings below may reference images not displayed]

FINDINGS: The patient's endotracheal tube is seen ending approximately 2 cm
above the carina.

The lungs are well-aerated and clear. There is no evidence of focal
opacification, pleural effusion or pneumothorax.

The cardiomediastinal silhouette is within normal limits. No acute
osseous abnormalities are seen.
IMPRESSION: 1. Endotracheal tube seen ending 2 cm above the carina.
2. No acute cardiopulmonary process seen.

## 2015-11-20 IMAGING — CT CT PARANASAL SINUSES W/O CM
3 series · 15 of 47 positions shown, 18 images · non-contrast
Comparison: Head CT obtained this same date.

CLINICAL DATA: G all seizure. History of chronic sinusitis. Patient
unable to give history.

EXAM:
CT PARANASAL SINUS WITHOUT CONTRAST
TECHNIQUE: Multidetector CT images of the paranasal sinuses were obtained using
the standard protocol without intravenous contrast.

[Series 3: axial soft · axial · 0.37mm/px · z∈[-160,-68]mm · 9 of 54 slices shown, 12 images]
[im 4/54  brain]
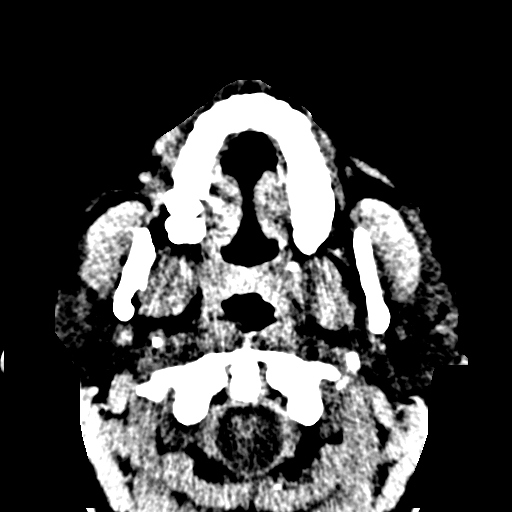
[im 4/54  bone]
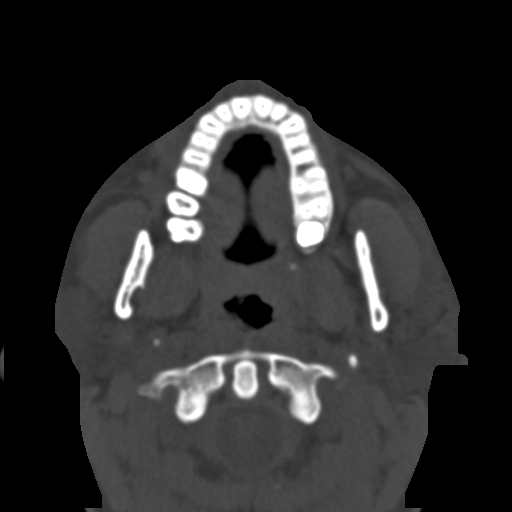
[im 10/54  bone]
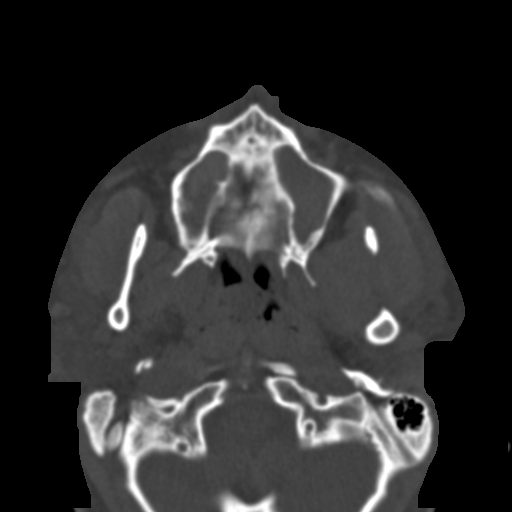
[im 15/54  bone]
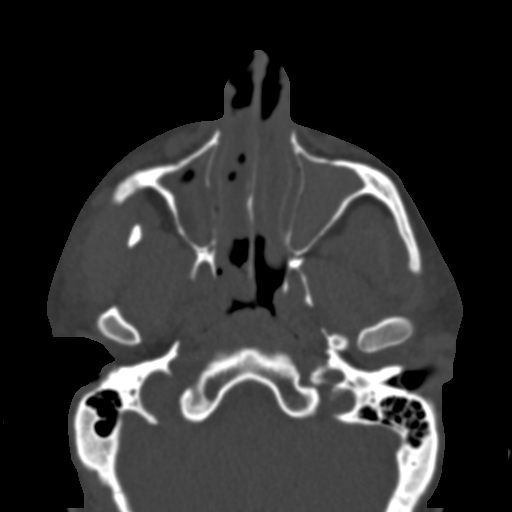
[im 21/54  bone]
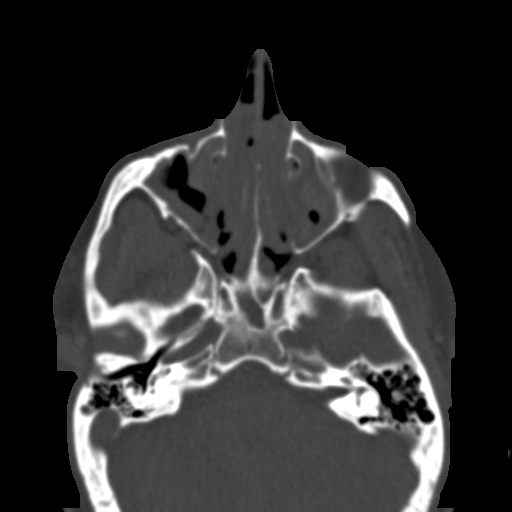
[im 28/54  brain]
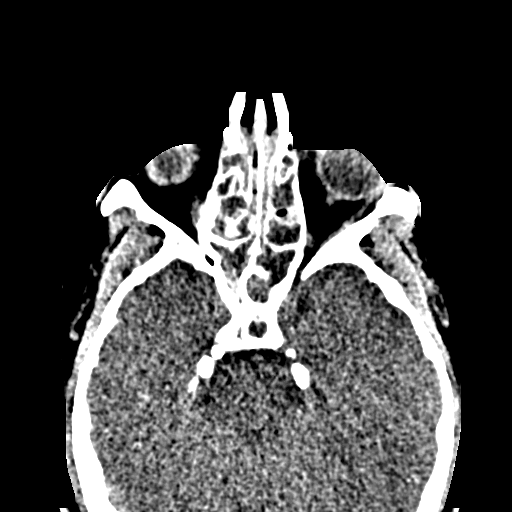
[im 28/54  bone]
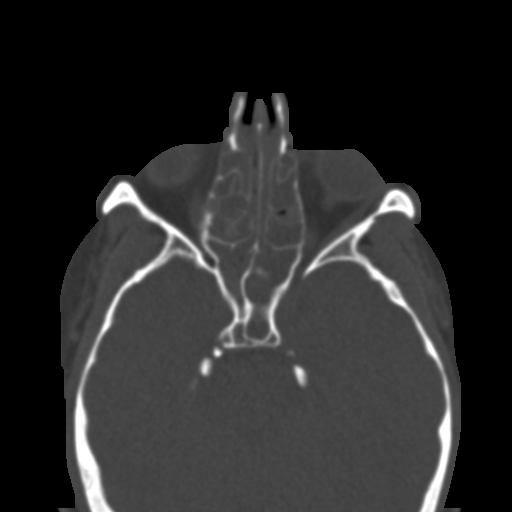
[im 33/54  bone]
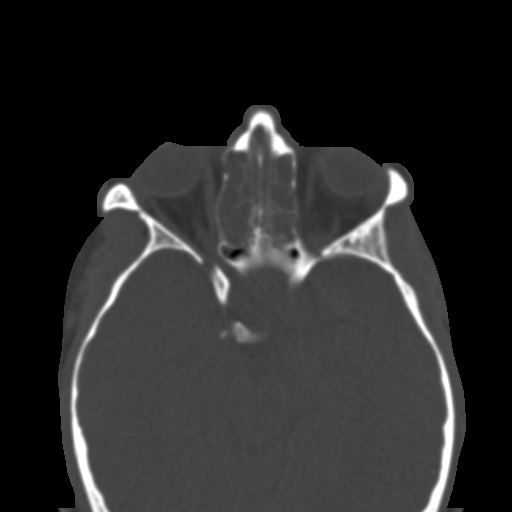
[im 39/54  bone]
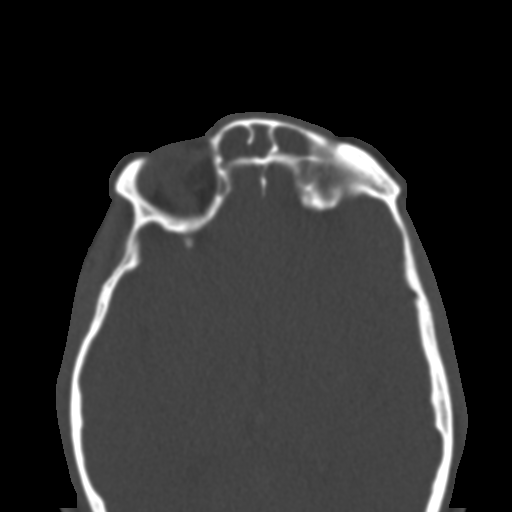
[im 44/54  bone]
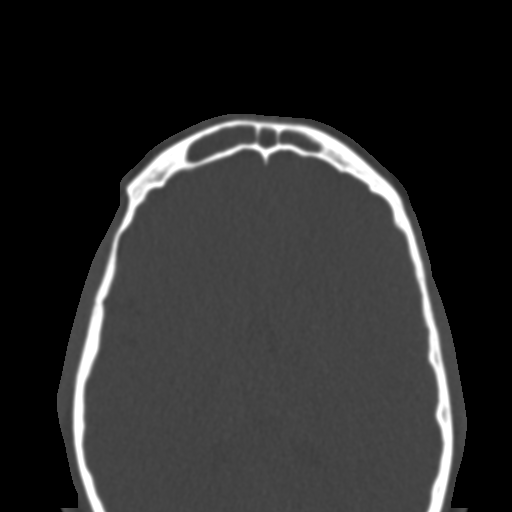
[im 50/54  brain]
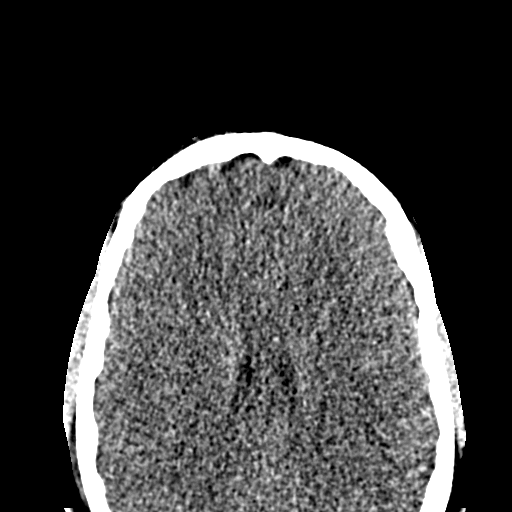
[im 50/54  bone]
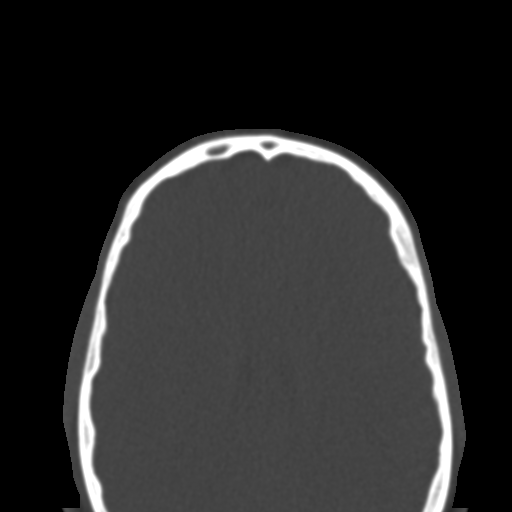

[Series 4: coronal bone · coronal · 0.27mm/px · 3 of 77 slices shown]
[im 26/77  bone]
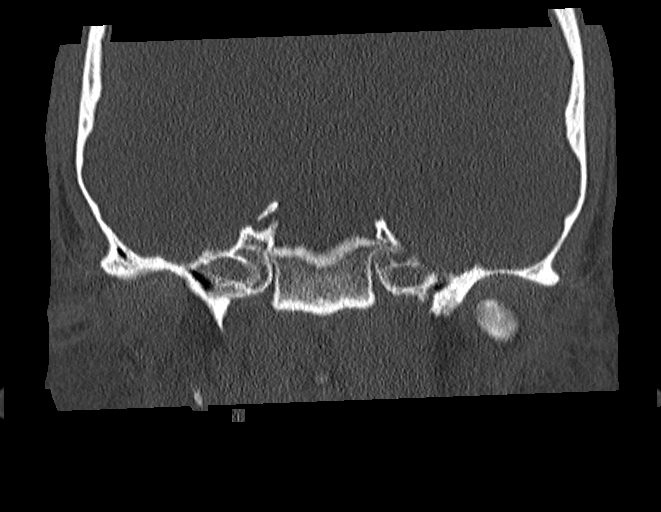
[im 34/77  bone]
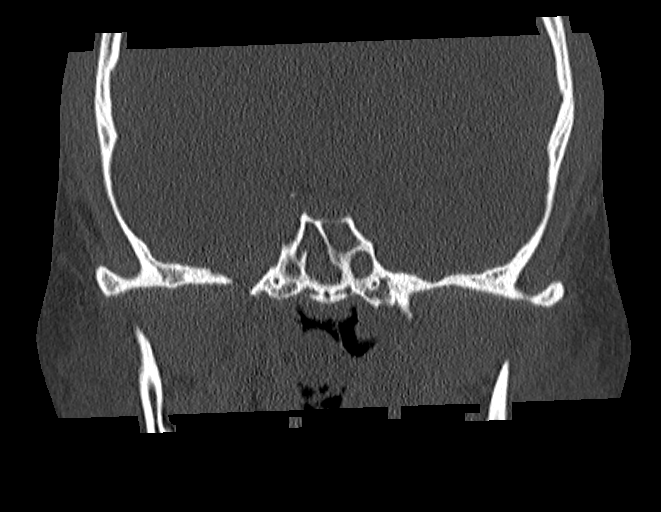
[im 43/77  bone]
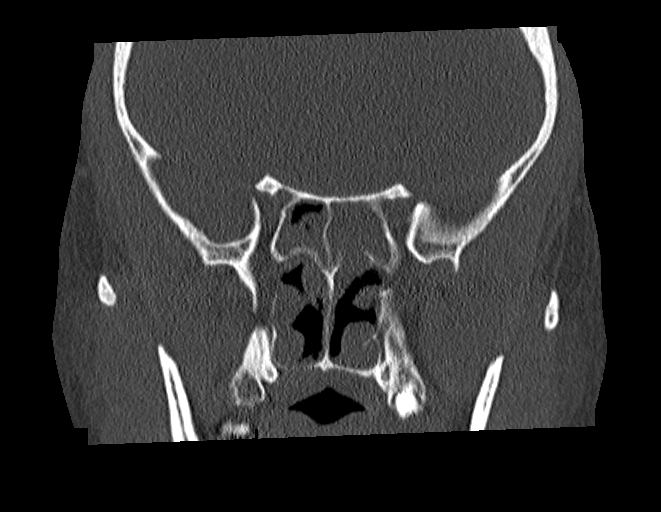

[Series 5: sagittal bone · sagittal · 0.32mm/px · 3 of 80 slices shown]
[im 27/80  bone]
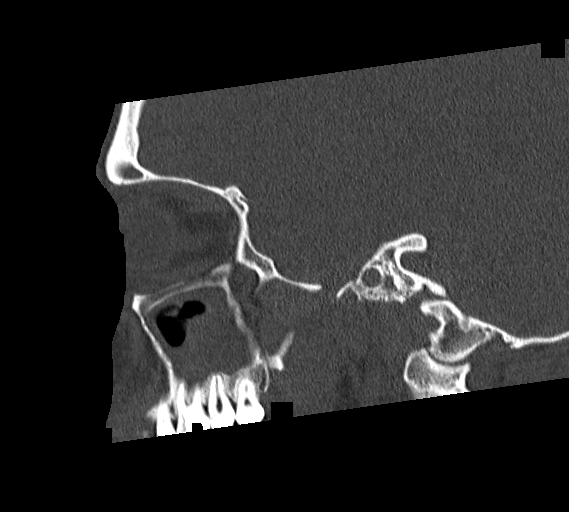
[im 40/80  bone]
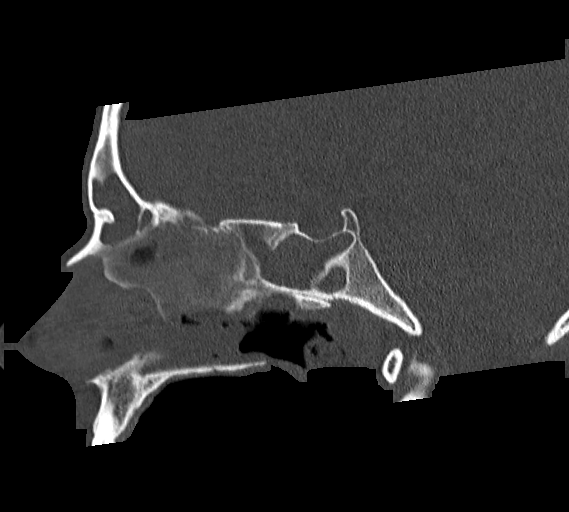
[im 53/80  bone]
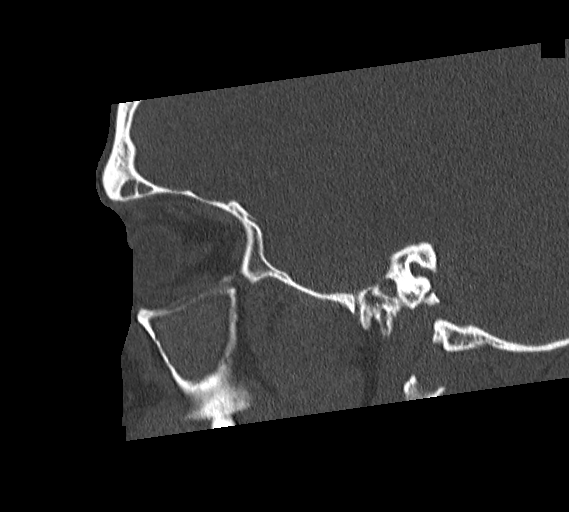

[15 of 47 positions shown; findings below may reference images not displayed]

FINDINGS: There is opacification of the frontal sinuses, near complete
opacification of the ethmoid sinuses, sphenoid sinuses and left
maxillary sinus and moderate mucosal thickening opacifying over 50%
of the right maxillary sinus. No air-fluid levels.

Soft tissue mostly occludes the nasal cavity consistent with a
combination of secretions and mucosal thickening. Rounded area of
presumed dependent secretions noted in the posterior nasopharynx.

Mastoid air cells and middle ear cavities are clear.

Ostiomeatal complexes, sphenoidal ethmoidal and frontal ethmoidal
recesses are occluded. Septum is near midline.

There are no areas of bone sclerosis or resorption.

Globes and orbits are unremarkable. There are no soft tissue masses.
No significant mucosal asymmetry. No adenopathy.
IMPRESSION: 1. There is significant sinus disease as detailed above, with most
sinus cavities either opacified or near completely opacified by
mucosal thickening and trapped secretions. The ostiomeatal complexes
are occluded. The frontal ethmoidal and sphenoid ethmoidal recesses
are also occluded.
2. No bone resorption or sclerosis. No evidence of sinus wall
dehiscence.

## 2016-01-19 ENCOUNTER — Encounter: Payer: Self-pay | Admitting: Family Medicine

## 2016-01-19 ENCOUNTER — Ambulatory Visit (INDEPENDENT_AMBULATORY_CARE_PROVIDER_SITE_OTHER): Payer: BLUE CROSS/BLUE SHIELD | Admitting: Family Medicine

## 2016-01-19 VITALS — BP 126/74 | HR 88 | Temp 97.4°F | Resp 20 | Ht 74.0 in | Wt 249.0 lb

## 2016-01-19 DIAGNOSIS — L304 Erythema intertrigo: Secondary | ICD-10-CM | POA: Diagnosis not present

## 2016-01-19 DIAGNOSIS — Z0000 Encounter for general adult medical examination without abnormal findings: Secondary | ICD-10-CM

## 2016-01-19 MED ORDER — AZELASTINE HCL 0.1 % NA SOLN: 2.0000 | Freq: Two times a day (BID) | NASAL | Status: AC

## 2016-01-19 MED ORDER — CLOTRIMAZOLE-BETAMETHASONE 1-0.05 % EX CREA: 1.0000 "application " | TOPICAL_CREAM | Freq: Two times a day (BID) | CUTANEOUS | Status: DC

## 2016-01-19 MED ORDER — FLUTICASONE PROPIONATE 50 MCG/ACT NA SUSP: 2.0000 | Freq: Every day | NASAL | Status: AC

## 2016-01-19 NOTE — Progress Notes (Signed)
Subjective:    Patient ID: Jason Duffy, male    DOB: [birth year], 25 y.o.   MRN: [id number]  HPI Patient is a 25 year old white male here today for complete physical exam. I recommended a flu shot but he declined that. He has had the pneumonia vaccine during her previous hospitalization in the last 2 years. He is due for fasting lab work. He does have a red rash in his growing. The rash is an erythematous patch with a serpiginous border consistent with intertrigo. It is on his medial upper right thigh as well as the scrotum. The patient states that it itches. Past Medical History  Diagnosis Date  . Allergy 2010  . Asthma 2008  . Seizures (HCC)   . Recurrent sinusitis   . Sinusitis nasal   . GERD (gastroesophageal reflux disease)     Tums as needed  . Seasonal allergies    Past Surgical History  Procedure Laterality Date  . Tonsillectomy  11/2009  . Sinus endo w/fusion Bilateral 06/03/2015    Procedure: ENDOSCOPIC SINUS SURGERY WITH NAVIGATION AND  BILATERAL SPHENOIDOTOMY WITH TISSUE REMOVAL AND FUSION IMAGE GUIDANCE;  Surgeon: Christia Reading, MD;  Location: [facility] OR;  Service: ENT;  Laterality: Bilateral;  . Ethmoidectomy Bilateral 06/03/2015    Procedure: TOTAL ETHMOIDECTOMY -  BILATERAL FRONTAL RECESS EXPLORATION;  Surgeon: Christia Reading, MD;  Location: [facility] OR;  Service: ENT;  Laterality: Bilateral;  . Maxillary antrostomy Bilateral 06/03/2015    Procedure: MAXILLARY ANTROSTOMY with Tissue Removal;  Surgeon: Christia Reading, MD;  Location: [facility] OR;  Service: ENT;  Laterality: Bilateral;   Current Outpatient Prescriptions on File Prior to Visit  Medication Sig Dispense Refill  . albuterol (PROVENTIL HFA;VENTOLIN HFA) 108 (90 BASE) MCG/ACT inhaler Inhale 2 puffs into the lungs every 6 (six) hours as needed for wheezing or shortness of breath. 1 Inhaler 2  . albuterol (PROVENTIL) (5 MG/ML) 0.5% nebulizer solution Take 2.5 mg by nebulization every 6 (six) hours as needed for wheezing or shortness of  breath.    . budesonide-formoterol (SYMBICORT) 160-4.5 MCG/ACT inhaler Inhale 2 puffs into the lungs 2 (two) times daily. 1 Inhaler 11  . azelastine (ASTELIN) 0.1 % nasal spray Place 2 sprays into both nostrils 2 (two) times daily. Use in each nostril as directed (Patient not taking: Reported on 01/19/2016) 30 mL 12   No current facility-administered medications on file prior to visit.   Allergies  Allergen Reactions  . Asa [Aspirin]   . Ibuprofen    Social History   Social History  . Marital Status: Single    Spouse Name: N/A  . Number of Children: N/A  . Years of Education: N/A   Occupational History  . Not on file.   Social History Main Topics  . Smoking status: Former Smoker    Quit date: 02/20/2015  . Smokeless tobacco: Current User    Types: Chew  . Alcohol Use: Yes     Comment: 6 mixed drinks a week  . Drug Use: No  . Sexual Activity: Yes    Birth Control/ Protection: Condom   Other Topics Concern  . Not on file   Social History Narrative   Family History  Problem Relation Age of Onset  . Depression Mother   . Early death Mother   . Asthma Father   . Mental illness Father     PTSD  . Asthma Brother   . Birth defects Brother   . Arthritis Maternal Grandmother   . Birth defects  Maternal Grandmother   . Diabetes Maternal Grandmother   . Hyperlipidemia Maternal Grandmother   . Hypertension Maternal Grandmother   . Vision loss Maternal Grandmother   . Diabetes Maternal Grandfather   . Hypertension Maternal Grandfather   . Vision loss Maternal Grandfather   . Cancer Paternal Grandmother   . Depression Paternal Grandmother   . Diabetes Paternal Grandmother   . Hyperlipidemia Paternal Grandmother   . Hypertension Paternal Grandmother   . Stroke Paternal Grandmother   . Vision loss Paternal Grandmother   . Depression Paternal Grandfather   . Diabetes Paternal Grandfather   . Hypertension Paternal Grandfather   . Vision loss Paternal Grandfather       Review of Systems  All other systems reviewed and are negative.      Objective:   Physical Exam  Constitutional: He is oriented to person, place, and time. He appears well-developed and well-nourished. No distress.  HENT:  Head: Normocephalic and atraumatic.  Right Ear: External ear normal.  Left Ear: External ear normal.  Nose: Nose normal.  Mouth/Throat: Oropharynx is clear and moist. No oropharyngeal exudate.  Eyes: Conjunctivae and EOM are normal. Pupils are equal, round, and reactive to light. Right eye exhibits no discharge. Left eye exhibits no discharge. No scleral icterus.  Neck: Normal range of motion. Neck supple. No JVD present. No tracheal deviation present. No thyromegaly present.  Cardiovascular: Normal rate, regular rhythm and intact distal pulses.  Exam reveals no gallop and no friction rub.   No murmur heard. Pulmonary/Chest: Effort normal and breath sounds normal. No stridor. No respiratory distress. He has no wheezes. He has no rales. He exhibits no tenderness.  Abdominal: Soft. Bowel sounds are normal. He exhibits no distension and no mass. There is no tenderness. There is no rebound and no guarding.  Musculoskeletal: Normal range of motion. He exhibits no edema or tenderness.  Lymphadenopathy:    He has no cervical adenopathy.  Neurological: He is alert and oriented to person, place, and time. He has normal reflexes. He displays normal reflexes. No cranial nerve deficit. He exhibits normal muscle tone. Coordination normal.  Skin: Skin is warm. No rash noted. He is not diaphoretic. No erythema. No pallor.  Psychiatric: He has a normal mood and affect. His behavior is normal. Judgment and thought content normal.  Vitals reviewed.         Assessment & Plan:  Routine general medical examination at a health care facility - Plan: CBC with Differential/Platelet, COMPLETE METABOLIC PANEL WITH GFR, Lipid panel  Intertrigo - Plan: clotrimazole-betamethasone  (LOTRISONE) cream  Physical exam is significant for obesity. I recommended diet exercise and weight loss. I recommended a flu shot but he declined. I will treat intertrigo with Lotrisone cream twice a day for 10-14 days. I would like the patient to return fasting for a CBC, CMP, fasting lipid panel.

## 2016-02-15 ENCOUNTER — Telehealth: Payer: Self-pay | Admitting: Family Medicine

## 2016-02-15 NOTE — Telephone Encounter (Signed)
Received a PA for symbicort - his ins will cover Advair, Breo or Dulera if we can change it or I could do the PA-  just would need a reason.

## 2016-02-16 NOTE — Telephone Encounter (Signed)
Switch to advair 250/50 inh bid.

## 2016-02-16 NOTE — Telephone Encounter (Signed)
LMTRC

## 2016-02-23 ENCOUNTER — Other Ambulatory Visit: Payer: BLUE CROSS/BLUE SHIELD

## 2016-02-23 MED ORDER — FLUTICASONE-SALMETEROL 250-50 MCG/DOSE IN AEPB: 1.0000 | INHALATION_SPRAY | Freq: Two times a day (BID) | RESPIRATORY_TRACT | Status: DC

## 2016-02-23 NOTE — Telephone Encounter (Signed)
Medication called/sent to requested pharmacy and pt aware 

## 2016-02-24 LAB — COMPLETE METABOLIC PANEL WITH GFR
ALK PHOS: 49 U/L (ref 40–115)
ALT: 47 U/L — ABNORMAL HIGH (ref 9–46)
AST: 34 U/L (ref 10–40)
Albumin: 4.4 g/dL (ref 3.6–5.1)
BUN: 12 mg/dL (ref 7–25)
CALCIUM: 9.5 mg/dL (ref 8.6–10.3)
CO2: 24 mmol/L (ref 20–31)
Chloride: 103 mmol/L (ref 98–110)
Creat: 0.79 mg/dL (ref 0.60–1.35)
GFR, Est African American: >89 mL/min (ref >=60)
GLUCOSE: 73 mg/dL (ref 70–99)
POTASSIUM: 4.4 mmol/L (ref 3.5–5.3)
SODIUM: 138 mmol/L (ref 135–146)
Total Bilirubin: 0.4 mg/dL (ref 0.2–1.2)
Total Protein: 7.4 g/dL (ref 6.1–8.1)

## 2016-02-24 LAB — CBC WITH DIFFERENTIAL/PLATELET
Basophils Absolute: 0.1 10*3/uL (ref 0.0–0.1)
Basophils Relative: 1 % (ref 0–1)
Eosinophils Absolute: 0.7 10*3/uL (ref 0.0–0.7)
Eosinophils Relative: 12 % — ABNORMAL HIGH (ref 0–5)
HCT: 47.1 % (ref 39.0–52.0)
Hemoglobin: 16.5 g/dL (ref 13.0–17.0)
Lymphocytes Relative: 36 % (ref 12–46)
Lymphs Abs: 2.2 10*3/uL (ref 0.7–4.0)
MCH: 27.2 pg (ref 26.0–34.0)
MCHC: 35 g/dL (ref 30.0–36.0)
MCV: 77.6 fL — ABNORMAL LOW (ref 78.0–100.0)
MONO ABS: 0.7 10*3/uL (ref 0.1–1.0)
MONOS PCT: 12 % (ref 3–12)
MPV: 10.2 fL (ref 8.6–12.4)
Neutro Abs: 2.4 10*3/uL (ref 1.7–7.7)
Neutrophils Relative %: 39 % — ABNORMAL LOW (ref 43–77)
Platelets: 191 10*3/uL (ref 150–400)
RBC: 6.07 MIL/uL — ABNORMAL HIGH (ref 4.22–5.81)
RDW: 14.6 % (ref 11.5–15.5)
WBC: 6.1 10*3/uL (ref 4.0–10.5)

## 2016-02-24 LAB — LIPID PANEL
Cholesterol: 153 mg/dL (ref 125–200)
HDL: 27 mg/dL — ABNORMAL LOW (ref >=40)
LDL Cholesterol: 97 mg/dL (ref <130)
Total CHOL/HDL Ratio: 5.7 Ratio — ABNORMAL HIGH (ref <=5.0)
Triglycerides: 147 mg/dL (ref <150)
VLDL: 29 mg/dL (ref <30)

## 2016-02-29 ENCOUNTER — Encounter: Payer: Self-pay | Admitting: Family Medicine

## 2016-04-12 ENCOUNTER — Ambulatory Visit (INDEPENDENT_AMBULATORY_CARE_PROVIDER_SITE_OTHER): Payer: BLUE CROSS/BLUE SHIELD | Admitting: Family Medicine

## 2016-04-12 ENCOUNTER — Encounter: Payer: Self-pay | Admitting: Family Medicine

## 2016-04-12 VITALS — BP 130/82 | HR 60 | Temp 97.4°F | Resp 18 | Ht 74.0 in | Wt 250.0 lb

## 2016-04-12 DIAGNOSIS — L304 Erythema intertrigo: Secondary | ICD-10-CM | POA: Diagnosis not present

## 2016-04-12 DIAGNOSIS — J328 Other chronic sinusitis: Secondary | ICD-10-CM

## 2016-04-12 MED ORDER — PREDNISONE 20 MG PO TABS: ORAL_TABLET | ORAL | Status: AC

## 2016-04-12 MED ORDER — CLOTRIMAZOLE-BETAMETHASONE 1-0.05 % EX CREA: 1.0000 "application " | TOPICAL_CREAM | Freq: Two times a day (BID) | CUTANEOUS | Status: AC

## 2016-04-12 MED ORDER — MONTELUKAST SODIUM 10 MG PO TABS: 10.0000 mg | ORAL_TABLET | Freq: Every day | ORAL | Status: DC

## 2016-04-12 NOTE — Progress Notes (Signed)
Subjective:    Patient ID: Jason Duffy, male    DOB: Nov 28, 1991, 25 y.o.   MRN: [id number]  HPI Patient has a history of chronic sinusitis along with moderate persistent asthma. He is currently on Claritin, Flonase, and Astelin to manage his allergies and prevent recurrent sinusitis. He is also had sinus surgery performed by Dr. Jenne Pane in the past. Recently over the last 2 months, his sinuses have become unbearable. He is unable to breathe through his nose. He's extremely congested. He reports daily constant rhinorrhea. He reports postnasal drip. His sinuses are aggravating his asthma. He does have faint expiratory wheezes bilaterally. Yesterday he had started using his albuterol on examination today his nasal passages are completely occluded with mucus. I am unable to appreciate any nasal polyp purulent material due to the copious volume of clear mucus and swelling  Past Medical History  Diagnosis Date  . Allergy 2010  . Asthma 2008  . Seizures (HCC)   . Recurrent sinusitis   . Sinusitis nasal   . GERD (gastroesophageal reflux disease)     Tums as needed  . Seasonal allergies    Past Surgical History  Procedure Laterality Date  . Tonsillectomy  11/2009  . Sinus endo w/fusion Bilateral 06/03/2015    Procedure: ENDOSCOPIC SINUS SURGERY WITH NAVIGATION AND  BILATERAL SPHENOIDOTOMY WITH TISSUE REMOVAL AND FUSION IMAGE GUIDANCE;  Surgeon: Christia Reading, MD;  Location: [facility] OR;  Service: ENT;  Laterality: Bilateral;  . Ethmoidectomy Bilateral 06/03/2015    Procedure: TOTAL ETHMOIDECTOMY -  BILATERAL FRONTAL RECESS EXPLORATION;  Surgeon: Christia Reading, MD;  Location: [facility] OR;  Service: ENT;  Laterality: Bilateral;  . Maxillary antrostomy Bilateral 06/03/2015    Procedure: MAXILLARY ANTROSTOMY with Tissue Removal;  Surgeon: Christia Reading, MD;  Location: [facility] OR;  Service: ENT;  Laterality: Bilateral;   Current Outpatient Prescriptions on File Prior to Visit  Medication Sig Dispense Refill  . albuterol  (PROVENTIL HFA;VENTOLIN HFA) 108 (90 BASE) MCG/ACT inhaler Inhale 2 puffs into the lungs every 6 (six) hours as needed for wheezing or shortness of breath. 1 Inhaler 2  . albuterol (PROVENTIL) (5 MG/ML) 0.5% nebulizer solution Take 2.5 mg by nebulization every 6 (six) hours as needed for wheezing or shortness of breath.    Marland Kitchen azelastine (ASTELIN) 0.1 % nasal spray Place 2 sprays into both nostrils 2 (two) times daily. Use in each nostril as directed 30 mL 12  . budesonide-formoterol (SYMBICORT) 160-4.5 MCG/ACT inhaler Inhale 2 puffs into the lungs 2 (two) times daily. 1 Inhaler 11  . clotrimazole-betamethasone (LOTRISONE) cream Apply 1 application topically 2 (two) times daily. 30 g 0  . fluticasone (FLONASE) 50 MCG/ACT nasal spray Place 2 sprays into both nostrils daily. 16 g 6  . Fluticasone-Salmeterol (ADVAIR) 250-50 MCG/DOSE AEPB Inhale 1 puff into the lungs 2 (two) times daily. 60 each 5   No current facility-administered medications on file prior to visit.   Allergies  Allergen Reactions  . Asa [Aspirin]   . Ibuprofen    Social History   Social History  . Marital Status: Single    Spouse Name: N/A  . Number of Children: N/A  . Years of Education: N/A   Occupational History  . Not on file.   Social History Main Topics  . Smoking status: Former Smoker    Quit date: 02/20/2015  . Smokeless tobacco: Current User    Types: Chew  . Alcohol Use: Yes     Comment: 6 mixed drinks a week  .  Drug Use: No  . Sexual Activity: Yes    Birth Control/ Protection: Condom   Other Topics Concern  . Not on file   Social History Narrative   Family History  Problem Relation Age of Onset  . Depression Mother   . Early death Mother   . Asthma Father   . Mental illness Father     PTSD  . Asthma Brother   . Birth defects Brother   . Arthritis Maternal Grandmother   . Birth defects Maternal Grandmother   . Diabetes Maternal Grandmother   . Hyperlipidemia Maternal Grandmother   .  Hypertension Maternal Grandmother   . Vision loss Maternal Grandmother   . Diabetes Maternal Grandfather   . Hypertension Maternal Grandfather   . Vision loss Maternal Grandfather   . Cancer Paternal Grandmother   . Depression Paternal Grandmother   . Diabetes Paternal Grandmother   . Hyperlipidemia Paternal Grandmother   . Hypertension Paternal Grandmother   . Stroke Paternal Grandmother   . Vision loss Paternal Grandmother   . Depression Paternal Grandfather   . Diabetes Paternal Grandfather   . Hypertension Paternal Grandfather   . Vision loss Paternal Grandfather      Review of Systems  All other systems reviewed and are negative.      Objective:   Physical Exam  Constitutional: He appears well-developed and well-nourished.  HENT:  Right Ear: External ear normal.  Left Ear: External ear normal.  Nose: Mucosal edema and rhinorrhea present. Right sinus exhibits maxillary sinus tenderness and frontal sinus tenderness. Left sinus exhibits maxillary sinus tenderness and frontal sinus tenderness.  Mouth/Throat: Oropharynx is clear and moist. No oropharyngeal exudate.  Cardiovascular: Normal rate, regular rhythm and intact distal pulses.  Exam reveals no gallop and no friction rub.   No murmur heard. Pulmonary/Chest: Effort normal. No respiratory distress. He has no decreased breath sounds. He has wheezes. He has no rhonchi. He has no rales. He exhibits no tenderness.  Psychiatric: He has a normal mood and affect. His behavior is normal. Judgment and thought content normal.  Vitals reviewed.         Assessment & Plan:  Other chronic sinusitis - Plan: montelukast (SINGULAIR) 10 MG tablet, predniSONE (DELTASONE) 20 MG tablet, Ambulatory referral to ENT  [facility] - Plan: clotrimazole-betamethasone (LOTRISONE) cream  I will maximize therapy for allergies and asthma. Continue Claritin, Flonase, Astelin, and Advair. Add Singulair 10 mg by mouth daily. I will give the patient  a prednisone taper pack to help calm his allergies as well and treat the mild asthma exacerbation he is experiencing. Meanwhile I will schedule him a follow-up appointment with Dr. Jenne PaneBates to discuss possible surgical options. We may also want consider referral to an allergist if there are no surgical options.

## 2016-05-30 ENCOUNTER — Encounter: Payer: Self-pay | Admitting: Family Medicine

## 2016-06-19 ENCOUNTER — Telehealth: Payer: Self-pay | Admitting: Family Medicine

## 2016-06-19 DIAGNOSIS — J4540 Moderate persistent asthma, uncomplicated: Secondary | ICD-10-CM

## 2016-06-19 DIAGNOSIS — J328 Other chronic sinusitis: Secondary | ICD-10-CM

## 2016-06-19 NOTE — Telephone Encounter (Signed)
Todd @ [facility] is requesting a 3 mo prescription for pt's montelukast, fluticasone, albuterol and proair.

## 2016-06-20 MED ORDER — MONTELUKAST SODIUM 10 MG PO TABS: 10.0000 mg | ORAL_TABLET | Freq: Every day | ORAL | Status: AC

## 2016-06-20 MED ORDER — ALBUTEROL SULFATE (5 MG/ML) 0.5% IN NEBU
2.5000 mg | INHALATION_SOLUTION | Freq: Four times a day (QID) | RESPIRATORY_TRACT | Status: AC | PRN
Start: 2016-06-20 — End: ?

## 2016-06-20 MED ORDER — ALBUTEROL SULFATE HFA 108 (90 BASE) MCG/ACT IN AERS: 2.0000 | INHALATION_SPRAY | Freq: Four times a day (QID) | RESPIRATORY_TRACT | Status: DC | PRN

## 2016-06-20 MED ORDER — FLUTICASONE-SALMETEROL 250-50 MCG/DOSE IN AEPB: 1.0000 | INHALATION_SPRAY | Freq: Two times a day (BID) | RESPIRATORY_TRACT | Status: DC

## 2016-06-20 NOTE — Telephone Encounter (Signed)
Medication called/sent to requested pharmacy  

## 2016-06-21 ENCOUNTER — Telehealth: Payer: Self-pay | Admitting: *Deleted

## 2016-06-21 NOTE — Telephone Encounter (Signed)
Received request from pharmacy for PA on Advair.   PA submitted.   Dx: J45.4- asthma

## 2016-06-22 NOTE — Telephone Encounter (Signed)
Received PA determination.   PA approved.  

## 2016-09-27 ENCOUNTER — Other Ambulatory Visit: Payer: Self-pay | Admitting: Family Medicine

## 2016-09-27 MED ORDER — ALBUTEROL SULFATE 108 (90 BASE) MCG/ACT IN AEPB: 2.0000 | INHALATION_SPRAY | RESPIRATORY_TRACT | 3 refills | Status: AC | PRN

## 2016-09-27 MED ORDER — FLUTICASONE FUROATE-VILANTEROL 200-25 MCG/INH IN AEPB: 1.0000 | INHALATION_SPRAY | Freq: Every day | RESPIRATORY_TRACT | 3 refills | Status: AC

## 2017-04-04 ENCOUNTER — Encounter: Payer: Self-pay | Admitting: Family Medicine

## 2017-05-01 ENCOUNTER — Encounter: Payer: Self-pay | Admitting: Family Medicine

## 2017-07-16 ENCOUNTER — Encounter: Payer: Self-pay | Admitting: Family Medicine

## 2018-05-01 ENCOUNTER — Encounter: Payer: Self-pay | Admitting: Emergency Medicine

## 2018-05-01 ENCOUNTER — Emergency Department
Admission: EM | Admit: 2018-05-01 | Discharge: 2018-05-01 | Disposition: A | Discharge status: Home / Self Care | Payer: Self-pay | Attending: Emergency Medicine | Admitting: Emergency Medicine

## 2018-05-01 ENCOUNTER — Other Ambulatory Visit: Payer: Self-pay

## 2018-05-01 DIAGNOSIS — T2016XA Burn of first degree of forehead and cheek, initial encounter: Secondary | ICD-10-CM | POA: Insufficient documentation

## 2018-05-01 DIAGNOSIS — T310 Burns involving less than 10% of body surface: Secondary | ICD-10-CM | POA: Insufficient documentation

## 2018-05-01 DIAGNOSIS — Y939 Activity, unspecified: Secondary | ICD-10-CM | POA: Insufficient documentation

## 2018-05-01 DIAGNOSIS — Z23 Encounter for immunization: Secondary | ICD-10-CM | POA: Insufficient documentation

## 2018-05-01 DIAGNOSIS — Y999 Unspecified external cause status: Secondary | ICD-10-CM | POA: Insufficient documentation

## 2018-05-01 DIAGNOSIS — J45909 Unspecified asthma, uncomplicated: Secondary | ICD-10-CM | POA: Insufficient documentation

## 2018-05-01 DIAGNOSIS — X19XXXA Contact with other heat and hot substances, initial encounter: Secondary | ICD-10-CM | POA: Insufficient documentation

## 2018-05-01 DIAGNOSIS — T22291A Burn of second degree of multiple sites of right shoulder and upper limb, except wrist and hand, initial encounter: Secondary | ICD-10-CM | POA: Insufficient documentation

## 2018-05-01 DIAGNOSIS — Y929 Unspecified place or not applicable: Secondary | ICD-10-CM | POA: Insufficient documentation

## 2018-05-01 MED ORDER — TETANUS-DIPHTH-ACELL PERTUSSIS 5-2.5-18.5 LF-MCG/0.5 IM SUSP
0.5000 mL | Freq: Once | INTRAMUSCULAR | Status: AC
  Administered 2018-05-01: 0.5 mL via INTRAMUSCULAR
  Filled 2018-05-01: qty 0.5

## 2018-05-01 MED ORDER — MORPHINE SULFATE (PF) 4 MG/ML IV SOLN
4.0000 mg | Freq: Once | INTRAVENOUS | Status: AC
  Administered 2018-05-01: 4 mg via INTRAVENOUS

## 2018-05-01 MED ORDER — SILVER SULFADIAZINE 1 % EX CREA: TOPICAL_CREAM | CUTANEOUS | 1 refills | Status: AC

## 2018-05-01 MED ORDER — MORPHINE SULFATE (PF) 4 MG/ML IV SOLN
INTRAVENOUS | Status: AC
  Administered 2018-05-01: 4 mg via INTRAVENOUS
  Filled 2018-05-01: qty 1

## 2018-05-01 MED ORDER — SILVER SULFADIAZINE 1 % EX CREA
TOPICAL_CREAM | Freq: Once | CUTANEOUS | Status: AC
  Administered 2018-05-01: 17:00:00 via TOPICAL
  Filled 2018-05-01 (×2): qty 85

## 2018-05-01 MED ORDER — SODIUM CHLORIDE 0.9 % IV SOLN
Freq: Once | INTRAVENOUS | Status: AC
  Administered 2018-05-01: 15:00:00 via INTRAVENOUS

## 2018-05-01 MED ORDER — OXYCODONE-ACETAMINOPHEN 5-325 MG PO TABS: 1.0000 | ORAL_TABLET | ORAL | 0 refills | Status: AC | PRN
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
